# Patient Record
Sex: Male | Born: 1985
Health system: Southern US, Community
[De-identification: ages and names within clinical notes are randomized; demographics above are authoritative.]

## PROBLEM LIST (undated history)

## (undated) DIAGNOSIS — I10 Essential (primary) hypertension: Secondary | ICD-10-CM

## (undated) DIAGNOSIS — N200 Calculus of kidney: Secondary | ICD-10-CM

## (undated) HISTORY — DX: Essential (primary) hypertension: I10

## (undated) HISTORY — PX: TONSILLECTOMY: SUR1361

---
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
## 2008-04-04 ENCOUNTER — Ambulatory Visit (HOSPITAL_COMMUNITY): Admission: RE | Admit: 2008-04-04 | Discharge: 2008-04-04 | Payer: Self-pay | Admitting: Family Medicine

## 2015-10-10 ENCOUNTER — Encounter (HOSPITAL_COMMUNITY)
Admission: RE | Admit: 2015-10-10 | Discharge: 2015-10-10 | Disposition: A | Discharge status: Home / Self Care | Payer: BLUE CROSS/BLUE SHIELD | Source: Ambulatory Visit | Attending: Surgery | Admitting: Surgery

## 2015-10-10 ENCOUNTER — Encounter (HOSPITAL_COMMUNITY): Payer: Self-pay

## 2015-10-10 DIAGNOSIS — I10 Essential (primary) hypertension: Secondary | ICD-10-CM | POA: Diagnosis not present

## 2015-10-10 DIAGNOSIS — E663 Overweight: Secondary | ICD-10-CM | POA: Diagnosis not present

## 2015-10-10 DIAGNOSIS — E785 Hyperlipidemia, unspecified: Secondary | ICD-10-CM | POA: Diagnosis not present

## 2015-10-10 DIAGNOSIS — K645 Perianal venous thrombosis: Secondary | ICD-10-CM | POA: Diagnosis present

## 2015-10-10 DIAGNOSIS — K644 Residual hemorrhoidal skin tags: Secondary | ICD-10-CM | POA: Diagnosis not present

## 2015-10-10 DIAGNOSIS — F17210 Nicotine dependence, cigarettes, uncomplicated: Secondary | ICD-10-CM | POA: Diagnosis not present

## 2015-10-10 LAB — BASIC METABOLIC PANEL
Anion gap: 5 (ref 5–15)
BUN: 13 mg/dL (ref 6–20)
CO2: 28 mmol/L (ref 22–32)
Calcium: 9.1 mg/dL (ref 8.9–10.3)
Chloride: 105 mmol/L (ref 101–111)
Creatinine, Ser: 1.1 mg/dL (ref 0.61–1.24)
GFR calc Af Amer: >60 mL/min (ref >60)
GFR calc non Af Amer: >60 mL/min (ref >60)
Glucose, Bld: 85 mg/dL (ref 65–99)
Potassium: 4.5 mmol/L (ref 3.5–5.1)
Sodium: 138 mmol/L (ref 135–145)

## 2015-10-10 LAB — CBC WITH DIFFERENTIAL/PLATELET
Basophils Absolute: 0.1 10*3/uL (ref 0.0–0.1)
Basophils Relative: 1 %
Eosinophils Absolute: 0.7 10*3/uL (ref 0.0–0.7)
Eosinophils Relative: 7 %
HCT: 45.1 % (ref 39.0–52.0)
Hemoglobin: 15.5 g/dL (ref 13.0–17.0)
Lymphocytes Relative: 26 %
Lymphs Abs: 2.7 10*3/uL (ref 0.7–4.0)
MCH: 30.6 pg (ref 26.0–34.0)
MCHC: 34.4 g/dL (ref 30.0–36.0)
MCV: 89 fL (ref 78.0–100.0)
Monocytes Absolute: 0.8 10*3/uL (ref 0.1–1.0)
Monocytes Relative: 7 %
Neutro Abs: 6.2 10*3/uL (ref 1.7–7.7)
Neutrophils Relative %: 59 %
Platelets: 275 10*3/uL (ref 150–400)
RBC: 5.07 MIL/uL (ref 4.22–5.81)
RDW: 13 % (ref 11.5–15.5)
WBC: 10.4 10*3/uL (ref 4.0–10.5)

## 2015-10-10 NOTE — Pre-Procedure Instructions (Signed)
Patient given information to sign up for my chart at home. 

## 2015-10-10 NOTE — Patient Instructions (Signed)
Jerome Glover  10/10/2015     @   Your procedure is scheduled on  10/12/2015   Report to [facility] at  730  A.M.  Call this number if you have problems the morning of surgery:  [phone]   Remember:  Do not eat food or drink liquids after midnight.  Take these medicines the morning of surgery with A SIP OF WATER none   Do not wear jewelry, make-up or nail polish.  Do not wear lotions, powders, or perfumes.  You may wear deoderant.  Do not shave 48 hours prior to surgery.  Men may shave face and neck.  Do not bring valuables to the hospital.  [facility] is not responsible for any belongings or valuables.  Contacts, dentures or bridgework may not be worn into surgery.  Leave your suitcase in the car.  After surgery it may be brought to your room.  For patients admitted to the hospital, discharge time will be determined by your treatment team.  Patients discharged the day of surgery will not be allowed to drive home.   Name and phone number of your driver:   family Special instructions:  none  Please read over the following fact sheets that you were given. Coughing and Deep Breathing, Surgical Site Infection Prevention, Anesthesia Post-op Instructions and Care and Recovery After Surgery      Hemorrhoids Hemorrhoids are swollen veins around the rectum or anus. There are two types of hemorrhoids:   Internal hemorrhoids. These occur in the veins just inside the rectum. They may poke through to the outside and become irritated and painful.  External hemorrhoids. These occur in the veins outside the anus and can be felt as a painful swelling or hard lump near the anus. CAUSES  Pregnancy.   Obesity.   Constipation or diarrhea.   Straining to have a bowel movement.   Sitting for long periods on the toilet.  Heavy lifting or other activity that caused you to strain.  Anal intercourse. SYMPTOMS   Pain.   Anal itching  or irritation.   Rectal bleeding.   Fecal leakage.   Anal swelling.   One or more lumps around the anus.  DIAGNOSIS  Your caregiver may be able to diagnose hemorrhoids by visual examination. Other examinations or tests that may be performed include:   Examination of the rectal area with a gloved hand (digital rectal exam).   Examination of anal canal using a small tube (scope).   A blood test if you have lost a significant amount of blood.  A test to look inside the colon (sigmoidoscopy or colonoscopy). TREATMENT Most hemorrhoids can be treated at home. However, if symptoms do not seem to be getting better or if you have a lot of rectal bleeding, your caregiver may perform a procedure to help make the hemorrhoids get smaller or remove them completely. Possible treatments include:   Placing a rubber band at the base of the hemorrhoid to cut off the circulation (rubber band ligation).   Injecting a chemical to shrink the hemorrhoid (sclerotherapy).   Using a tool to burn the hemorrhoid (infrared light therapy).   Surgically removing the hemorrhoid (hemorrhoidectomy).   Stapling the hemorrhoid to block blood flow to the tissue (hemorrhoid stapling).  HOME CARE INSTRUCTIONS   Eat foods with fiber, such as whole grains, beans, nuts, fruits, and vegetables. Ask your doctor about taking products with added fiber in  them (fibersupplements).  Increase fluid intake. Drink enough water and fluids to keep your urine clear or pale yellow.   Exercise regularly.   Go to the bathroom when you have the urge to have a bowel movement. Do not wait.   Avoid straining to have bowel movements.   Keep the anal area dry and clean. Use wet toilet paper or moist towelettes after a bowel movement.   Medicated creams and suppositories may be used or applied as directed.   Only take over-the-counter or prescription medicines as directed by your caregiver.   Take warm sitz baths  for 15-20 minutes, 3-4 times a day to ease pain and discomfort.   Place ice packs on the hemorrhoids if they are tender and swollen. Using ice packs between sitz baths may be helpful.   Put ice in a plastic bag.   Place a towel between your skin and the bag.   Leave the ice on for 15-20 minutes, 3-4 times a day.   Do not use a donut-shaped pillow or sit on the toilet for long periods. This increases blood pooling and pain.  SEEK MEDICAL CARE IF:  You have increasing pain and swelling that is not controlled by treatment or medicine.  You have uncontrolled bleeding.  You have difficulty or you are unable to have a bowel movement.  You have pain or inflammation outside the area of the hemorrhoids. MAKE SURE YOU:  Understand these instructions.  Will watch your condition.  Will get help right away if you are not doing well or get worse.   This information is not intended to replace advice given to you by your health care provider. Make sure you discuss any questions you have with your health care provider.   Document Released: 04/04/2000 Document Revised: 03/24/2012 Document Reviewed: 02/10/2012 Elsevier Interactive Patient Education 2016 [employer] Surgical Procedures for Hemorrhoids, Care After Refer to this sheet in the next few weeks. These instructions provide you with information about caring for yourself after your procedure. Your health care provider may also give you more specific instructions. Your treatment has been planned according to current medical practices, but problems sometimes occur. Call your health care provider if you have any problems or questions after your procedure. WHAT TO EXPECT AFTER THE PROCEDURE After your procedure, it is common to have:  Rectal pain.  Pain when you are having a bowel movement.  Slight rectal bleeding. HOME CARE INSTRUCTIONS Medicines  Take over-the-counter and prescription medicines only as told by your health care  provider.  Do not drive or operate heavy machinery while taking prescription pain medicine.  Use a stool softener or a bulk laxative as told by your health care provider. Activities  Rest at home. Return to your normal activities as told by your health care provider.  Do not lift anything that is heavier than 10 lb (4.5 kg).  Do not sit for long periods of time. Take a walk every day or as told by your health care provider.  Do not strain to have a bowel movement. Do not spend a long time sitting on the toilet. Eating and Drinking  Eat foods that contain fiber, such as whole grains, beans, nuts, fruits, and vegetables.  Drink enough fluid to keep your urine clear or pale yellow. General Instructions  Sit in a warm bath 2-3 times per day to relieve soreness or itching.  Keep all follow-up visits as told by your health care provider. This is important. SEEK MEDICAL CARE  IF:  Your pain medicine is not helping.  You have a fever or chills.  You become constipated.  You have trouble passing urine. SEEK IMMEDIATE MEDICAL CARE IF:  You have very bad rectal pain.  You have heavy bleeding from your rectum.   This information is not intended to replace advice given to you by your health care provider. Make sure you discuss any questions you have with your health care provider.   Document Released: 06/28/2003 Document Revised: 12/27/2014 Document Reviewed: 07/03/2014 Elsevier Interactive Patient Education 2016 [employer] PATIENT INSTRUCTIONS POST-ANESTHESIA  IMMEDIATELY FOLLOWING SURGERY:  Do not drive or operate machinery for the first twenty four hours after surgery.  Do not make any important decisions for twenty four hours after surgery or while taking narcotic pain medications or sedatives.  If you develop intractable nausea and vomiting or a severe headache please notify your doctor immediately.  FOLLOW-UP:  Please make an appointment with your surgeon as instructed.  You do not need to follow up with anesthesia unless specifically instructed to do so.  WOUND CARE INSTRUCTIONS (if applicable):  Keep a dry clean dressing on the anesthesia/puncture wound site if there is drainage.  Once the wound has quit draining you may leave it open to air.  Generally you should leave the bandage intact for twenty four hours unless there is drainage.  If the epidural site drains for more than 36-48 hours please call the anesthesia department.  QUESTIONS?:  Please feel free to call your physician or the hospital operator if you have any questions, and they will be happy to assist you.

## 2015-10-12 ENCOUNTER — Encounter (HOSPITAL_COMMUNITY): Payer: Self-pay | Admitting: *Deleted

## 2015-10-12 ENCOUNTER — Ambulatory Visit (HOSPITAL_COMMUNITY)
Admission: RE | Admit: 2015-10-12 | Discharge: 2015-10-12 | Disposition: A | Discharge status: Home / Self Care | Payer: BLUE CROSS/BLUE SHIELD | Source: Ambulatory Visit | Attending: Surgery | Admitting: Surgery

## 2015-10-12 ENCOUNTER — Ambulatory Visit (HOSPITAL_COMMUNITY): Payer: BLUE CROSS/BLUE SHIELD | Admitting: Anesthesiology

## 2015-10-12 ENCOUNTER — Encounter (HOSPITAL_COMMUNITY)
Admission: RE | Disposition: A | Discharge status: Home / Self Care | Payer: Self-pay | Source: Ambulatory Visit | Attending: Surgery

## 2015-10-12 DIAGNOSIS — E663 Overweight: Secondary | ICD-10-CM | POA: Insufficient documentation

## 2015-10-12 DIAGNOSIS — K645 Perianal venous thrombosis: Secondary | ICD-10-CM | POA: Diagnosis not present

## 2015-10-12 DIAGNOSIS — I10 Essential (primary) hypertension: Secondary | ICD-10-CM | POA: Insufficient documentation

## 2015-10-12 DIAGNOSIS — K644 Residual hemorrhoidal skin tags: Secondary | ICD-10-CM | POA: Insufficient documentation

## 2015-10-12 DIAGNOSIS — F17210 Nicotine dependence, cigarettes, uncomplicated: Secondary | ICD-10-CM | POA: Insufficient documentation

## 2015-10-12 DIAGNOSIS — E785 Hyperlipidemia, unspecified: Secondary | ICD-10-CM | POA: Insufficient documentation

## 2015-10-12 HISTORY — PX: HEMORRHOID SURGERY: SHX153

## 2015-10-12 SURGERY — HEMORRHOIDECTOMY
Anesthesia: General

## 2015-10-12 MED ORDER — FENTANYL CITRATE (PF) 100 MCG/2ML IJ SOLN
INTRAMUSCULAR | Status: AC
  Filled 2015-10-12: qty 2

## 2015-10-12 MED ORDER — FENTANYL CITRATE (PF) 100 MCG/2ML IJ SOLN
25.0000 ug | INTRAMUSCULAR | Status: AC
  Administered 2015-10-12: 25 ug via INTRAVENOUS

## 2015-10-12 MED ORDER — MIDAZOLAM HCL 2 MG/2ML IJ SOLN
INTRAMUSCULAR | Status: AC
  Filled 2015-10-12: qty 2

## 2015-10-12 MED ORDER — ONDANSETRON HCL 4 MG/2ML IJ SOLN
INTRAMUSCULAR | Status: AC
  Filled 2015-10-12: qty 2

## 2015-10-12 MED ORDER — LIDOCAINE HCL 1 % IJ SOLN
INTRAMUSCULAR | Status: DC | PRN
  Administered 2015-10-12: 7 mL via INTRAMUSCULAR

## 2015-10-12 MED ORDER — LACTATED RINGERS IV SOLN
INTRAVENOUS | Status: DC
  Administered 2015-10-12 (×2): via INTRAVENOUS

## 2015-10-12 MED ORDER — HYDROMORPHONE HCL 1 MG/ML IJ SOLN
0.5000 mg | INTRAMUSCULAR | Status: DC | PRN
  Administered 2015-10-12: 0.5 mg via INTRAVENOUS

## 2015-10-12 MED ORDER — CEFAZOLIN SODIUM-DEXTROSE 2-4 GM/100ML-% IV SOLN
2.0000 g | INTRAVENOUS | Status: AC
  Administered 2015-10-12: 2 g via INTRAVENOUS

## 2015-10-12 MED ORDER — ETOMIDATE 2 MG/ML IV SOLN
INTRAVENOUS | Status: AC
  Filled 2015-10-12: qty 10

## 2015-10-12 MED ORDER — PROPOFOL 10 MG/ML IV BOLUS
INTRAVENOUS | Status: AC
  Filled 2015-10-12: qty 20

## 2015-10-12 MED ORDER — BUPIVACAINE HCL (PF) 0.5 % IJ SOLN
INTRAMUSCULAR | Status: AC
  Filled 2015-10-12: qty 30

## 2015-10-12 MED ORDER — CHLORHEXIDINE GLUCONATE CLOTH 2 % EX PADS: 6.0000 | MEDICATED_PAD | Freq: Once | CUTANEOUS | Status: DC

## 2015-10-12 MED ORDER — FENTANYL CITRATE (PF) 100 MCG/2ML IJ SOLN
25.0000 ug | INTRAMUSCULAR | Status: DC | PRN
  Administered 2015-10-12 (×4): 50 ug via INTRAVENOUS
  Filled 2015-10-12 (×2): qty 2

## 2015-10-12 MED ORDER — LIDOCAINE VISCOUS 2 % MT SOLN
OROMUCOSAL | Status: DC | PRN
  Administered 2015-10-12: 15 mL

## 2015-10-12 MED ORDER — LIDOCAINE HCL (CARDIAC) 10 MG/ML IV SOLN
INTRAVENOUS | Status: DC | PRN
  Administered 2015-10-12: 50 mg via INTRAVENOUS

## 2015-10-12 MED ORDER — OXYCODONE-ACETAMINOPHEN 5-325 MG PO TABS: 1.0000 | ORAL_TABLET | ORAL | Status: DC | PRN

## 2015-10-12 MED ORDER — LIDOCAINE HCL (PF) 1 % IJ SOLN
INTRAMUSCULAR | Status: AC
  Filled 2015-10-12: qty 30

## 2015-10-12 MED ORDER — CEFAZOLIN SODIUM-DEXTROSE 2-4 GM/100ML-% IV SOLN
INTRAVENOUS | Status: AC
  Filled 2015-10-12: qty 100

## 2015-10-12 MED ORDER — SURGILUBE EX GEL
CUTANEOUS | Status: DC | PRN
  Administered 2015-10-12: 1 via TOPICAL

## 2015-10-12 MED ORDER — MIDAZOLAM HCL 2 MG/2ML IJ SOLN
INTRAMUSCULAR | Status: DC | PRN
  Administered 2015-10-12 (×2): 1 mg via INTRAVENOUS

## 2015-10-12 MED ORDER — PROPOFOL 10 MG/ML IV BOLUS
INTRAVENOUS | Status: DC | PRN
  Administered 2015-10-12: 20 mg via INTRAVENOUS
  Administered 2015-10-12: 180 mg via INTRAVENOUS
  Administered 2015-10-12: 20 mg via INTRAVENOUS

## 2015-10-12 MED ORDER — HYDROMORPHONE HCL 1 MG/ML IJ SOLN
INTRAMUSCULAR | Status: AC
  Filled 2015-10-12: qty 1

## 2015-10-12 MED ORDER — LIDOCAINE VISCOUS 2 % MT SOLN
OROMUCOSAL | Status: AC
  Filled 2015-10-12: qty 15

## 2015-10-12 MED ORDER — ONDANSETRON HCL 4 MG/2ML IJ SOLN
4.0000 mg | Freq: Once | INTRAMUSCULAR | Status: AC
  Administered 2015-10-12: 4 mg via INTRAVENOUS

## 2015-10-12 MED ORDER — FENTANYL CITRATE (PF) 100 MCG/2ML IJ SOLN
INTRAMUSCULAR | Status: DC | PRN
  Administered 2015-10-12: 25 ug via INTRAVENOUS
  Administered 2015-10-12 (×2): 50 ug via INTRAVENOUS
  Administered 2015-10-12: 25 ug via INTRAVENOUS
  Administered 2015-10-12: 50 ug via INTRAVENOUS
  Administered 2015-10-12 (×2): 25 ug via INTRAVENOUS
  Administered 2015-10-12: 50 ug via INTRAVENOUS

## 2015-10-12 MED ORDER — ONDANSETRON HCL 4 MG/2ML IJ SOLN: 4.0000 mg | Freq: Once | INTRAMUSCULAR | Status: DC | PRN

## 2015-10-12 MED ORDER — 0.9 % SODIUM CHLORIDE (POUR BTL) OPTIME
TOPICAL | Status: DC | PRN
  Administered 2015-10-12: 1000 mL

## 2015-10-12 MED ORDER — MIDAZOLAM HCL 2 MG/2ML IJ SOLN
1.0000 mg | INTRAMUSCULAR | Status: DC | PRN
  Administered 2015-10-12: 2 mg via INTRAVENOUS

## 2015-10-12 SURGICAL SUPPLY — 27 items
BAG HAMPER (MISCELLANEOUS) ×2 IMPLANT
CLOTH BEACON ORANGE TIMEOUT ST (SAFETY) ×2 IMPLANT
COVER LIGHT HANDLE STERIS (MISCELLANEOUS) ×4 IMPLANT
DECANTER SPIKE VIAL GLASS SM (MISCELLANEOUS) ×4 IMPLANT
DRAPE PROXIMA HALF (DRAPES) ×2 IMPLANT
ELECT REM PT RETURN 9FT ADLT (ELECTROSURGICAL) ×2
ELECTRODE REM PT RTRN 9FT ADLT (ELECTROSURGICAL) ×1 IMPLANT
FORMALIN 10 PREFIL 120ML (MISCELLANEOUS) ×2 IMPLANT
GAUZE SPONGE 4X4 12PLY STRL (GAUZE/BANDAGES/DRESSINGS) ×2 IMPLANT
GLOVE BIOGEL M 7.0 STRL (GLOVE) ×2 IMPLANT
GLOVE BIOGEL PI IND STRL 7.0 (GLOVE) ×2 IMPLANT
GLOVE BIOGEL PI IND STRL 7.5 (GLOVE) ×1 IMPLANT
GLOVE BIOGEL PI INDICATOR 7.0 (GLOVE) ×2
GLOVE BIOGEL PI INDICATOR 7.5 (GLOVE) ×1
GLOVE ECLIPSE 7.0 STRL STRAW (GLOVE) ×2 IMPLANT
GOWN STRL REUS W/TWL LRG LVL3 (GOWN DISPOSABLE) ×2 IMPLANT
HEMOSTAT SURGICEL 4X8 (HEMOSTASIS) ×2 IMPLANT
KIT ROOM TURNOVER APOR (KITS) ×2 IMPLANT
LIGASURE IMPACT 36 18CM CVD LR (INSTRUMENTS) ×2 IMPLANT
MANIFOLD NEPTUNE II (INSTRUMENTS) ×2 IMPLANT
NEEDLE HYPO 25X1 1.5 SAFETY (NEEDLE) ×2 IMPLANT
NS IRRIG 1000ML POUR BTL (IV SOLUTION) ×2 IMPLANT
PACK PERI GYN (CUSTOM PROCEDURE TRAY) ×2 IMPLANT
PAD ARMBOARD 7.5X6 YLW CONV (MISCELLANEOUS) ×2 IMPLANT
SET BASIN LINEN APH (SET/KITS/TRAYS/PACK) ×2 IMPLANT
SUT SILK 0 FSL (SUTURE) ×2 IMPLANT
SYRINGE 10CC LL (SYRINGE) ×2 IMPLANT

## 2015-10-12 NOTE — H&P (Signed)
Subjective:This 30 year old male presents foranal pain and fullness x 5 days for which he sought evaluated by his PMD 2 days ago. Patient reports he has known hemorrhoids that cause itching and burning pain, flares of which are typically relieved with topical medications. However, the same medications provided no such relief this time. Patient reports frequent straining with chronically hard stools and denies blood with BM, nausea/vomiting, or fever/chills.   Review of Symptoms:  Constitutional:No fevers, chills, or unexplained weight loss Head:Atraumatic; no masses; no abnormalities Eyes:No visual changes or eye pain Nose/Mouth/Throat:No nasal congestion, rhinorrhea, oral lesions, postnasal drip or sore throat  Cardiovascular: No chest pain or palpitations.  Respiratory:No cough, shortness of breath or wheezing  Gastrointestin: Anal pain and fullness as per HPI,  no diarrhea, constipation, blood in stools, abdominal pain, vomiting or heartburn Genitourinary:No urinary frequency, hematuria, incontinence, or dysuria Musculoskeletal:No arthalgias, myalgias or joint swelling Skin:No rash or bothersome skin lesions Hematolgic/Lymphatic:No easy bruising, easy bleeding or swollen glands  Past Medical History  Surgical History:  None Medical Problems: High Blood pressure, Hyperlipidemia, Overweight  Social History  Preferred Language: English Race:  White Age: [age] year Marital Status:  S Lives with:  Spouse  Smoking Status: Current every day smoker reviewed on 10/11/2015 Started Date: 10/11/1999 Packs per day: 1.00  Functional Status reviewed on 10/10/2015 ------------------------------------------------ Bathing: Normal Cooking: Normal Dressing: Normal Driving: Normal Eating: Normal Managing Meds: Normal Oral Care: Normal Shopping: Normal Toileting: Normal Transferring: Normal Walking: Normal  Cognitive Status reviewed on  10/10/2015 ------------------------------------------------ Attention: Normal Decision Making: Normal Language: Normal Memory: Normal Motor: Normal Perception: Normal Problem Solving: Normal Visual and Spatial: Normal  Family Health History Mother, Living; Diabetes mellitus, type 2 (adult);  Father, Living; Hypertension (high blood pressure); Heart disease; Glaucoma  Vital Signs:  Vitals as of: 10/10/2015: Systolic 153: Diastolic 85: Heart Rate 62: Temp 98.90F (Temporal): Height 555ft 8in: Weight 202Lbs 0 Ounces: Pain Level 8: BMI 30.71   BMI : 30.71 kg/m2  Objective Information: General:Well appearing, well nourished in no distress. Skin:no rash or prominent lesions Head:Atraumatic; no masses; no abnormalities Eyes:conjunctiva clear, EOM intact, PERRL Mouth:Mucous membranes moist, no mucosal lesions. Throat:no erythema, exudates or lesions. Neck:Supple without lymphadenopathy.  Heart:RRR, no murmur Lungs:CTA bilaterally, no wheezes, rhonchi, rales.  Breathing unlabored. Abdomen:Soft, NT/ND, no HSM, no masses. Rectal: Hemorrhoids:  3  exquisitely tender thrombosed external  Extremities:No deformities, clubbing, cyanosis, or edema  Plan: - All risks, benefits, and alternatives to hemorrhoidectomy for painful thrombosed hemorrhoid(s) discussed with the patient, who elects to proceed, all of patient's and his wife's questions were answered to their expressed satisfaction, and informed consent obtained and documented - As patient just ate before this appointment and he's been tolerating the pain for 5 days now, will plan for elective hemorrhoidectomy in 2 days on Friday 10/12/2015 - Topical and oral systemic analgesia, along with 3x daily sitz baths   2 weeks post-operatively

## 2015-10-12 NOTE — Discharge Instructions (Addendum)
In addition to included general post-operative instructions for hemorrhoidectomy,  Diet: Resume home heart healthy diet, ensure adequate hydration (drink water) to reduce straining with hard stools.  Activity: No heavy lifting (children, pets, laundry) or strenuous activity until follow-up, but light activity and walking are encouraged. Do not drive or drink alcohol if taking narcotic pain medications.   Wound care: May shower. Also, may find pain relief improves with shallow hot water baths (+/- adding salt to the water). Topical pain medication (cream) may also be used for pain relief starting 2 days after surgery.  Medications: Resume all home medications. For mild to moderate pain: acetaminophen (Tylenol) or ibuprofen. Narcotic pain medication can be used for severe pain, though may cause nausea, constipation, and drowsiness. Do not combine Tylenol and Percocet within a 6 hour period as Percocet contains Tylenol.  Call office [phone]) at any time if any questions, worsening pain, fevers/chills, bleeding, drainage from incision site, or other concerns.

## 2015-10-12 NOTE — Transfer of Care (Signed)
Immediate Anesthesia Transfer of Care Note  Patient: Jerome Glover  Procedure(s) Performed: Procedure(s): EXTENSIVE HEMORRHOIDECTOMY (N/A)  Patient Location: PACU  Anesthesia Type:General  Level of Consciousness: awake and patient cooperative  Airway & Oxygen Therapy: Patient Spontanous Breathing and non-rebreather face mask  Post-op Assessment: Report given to RN, Post -op Vital signs reviewed and stable and Patient moving all extremities  Post vital signs: Reviewed and stable  Last Vitals:  Filed Vitals:   10/12/15 0816 10/12/15 1035  BP:  147/98  Pulse:  69  Temp: 36.7 C 36.8 C    Last Pain: There were no vitals filed for this visit.    Patients Stated Pain Goal: 5 (10/12/15 0809)  Complications: No apparent anesthesia complications

## 2015-10-12 NOTE — Op Note (Signed)
SURGICAL OPERATIVE REPORT   ATTENDING Surgeon(s): Ancil LinseyJason Evan Davis, MD  ASSISTANT(S): None   ANESTHESIA: GETA  PRE-OPERATIVE DIAGNOSIS: Exquisitely painful thrombosed very large (nearly circumferentially-extending) external hemorrhoids x2 (thrombosed 7:30 o'clock and large non-thrombosed 4:30 o'clock positions)  POST-OPERATIVE DIAGNOSIS: Exquisitely painful thrombosed very large (nearly circumferentially-extending) external hemorrhoids x2 (thrombosed 7:30 o'clock and large non-thrombosed 4:30 o'clock positions)  PROCEDURE(S):  1.) Anoscopy 2.) Extensive hemorrhectomy (Right posterior thrombosed external and Left posterior non-thrombosed external hemorrhoids) and excision of skin tag(s) x2  INTRAOPERATIVE FINDINGS: Very large (nearly circumferential) thrombosed and non-thrombosed external hemorrhoids x2 with skin tags  INTRAVENOUS FLUIDS: 1000 mL crystalloid   ESTIMATED BLOOD LOSS: Minimal (<20 mL)  URINE OUTPUT: No foley   SPECIMENS: Right posterior thrombosed external and Left posterior non-thrombosed external hemorrhoids and skin tag(s) x2  IMPLANTS: None  DRAINS: None   COMPLICATIONS: None apparent   CONDITION AT END OF PROCEDURE: Hemodynamically stable and extubated   DISPOSITION OF PATIENT: PACU  INDICATIONS FOR PROCEDURE:  Patient is a 30 y.o. male who presented with exquisitely painful external hemorrhoids no longer controlled with topical and oral pain medications patient typically utilizes for relief. On exam, very large Right posterior external hemorrhoid was found to be thrombosed, consistent with his described much worsened pain. All risks, benefits, and alternatives to above procedure were discussed with the patient and his wife, all of patient's and his wife's questions were answered to their expressed satisfaction, and informed consent was obtained and documented.  DETAILS OF PROCEDURE: Patient was brought to the operative suite and appropriately  identified. General anesthesia was administered along with appropriate pre-operative antibiotics, and endotracheal intubation was performed by anesthetist. In high lithotomy position, operative site was prepped and draped in the usual sterile fashion, and following a brief time out, rigid anoscopy was performed with findings of very large (nearly circumferential) thrombosed (Right posterior) and non-thrombosed (Left posterior) external hemorrhoids x2 with skin tags x2. Local anesthetic was injected, and three hemorrhoidal pedicles were identified. An Alice clamp was placed near the base of first the Right posterior pedicle and then the Left posterior pedicle near the dentate line and retracted externally to exteriorize the hemorrhoidal pedicle. Retracted hemorrhoid was carefully dissected from underlying/adherent tissues, and Ligasure was advanced across the base of the hemorrhoidal pedicle and fired, and the above was repeated for Left posterior external hemorrhoid as well. Hemostasis was confirmed, additional local anesthetic was injected, and lidocaine gel-soaked Surgicel wrapped around 4x4" gauze was inserted into the anal canal for additional hemostasis and pain control.  Patient was then safely able to be extubated, awaken, and transferred to PACU for post-operative monitoring and care.  I was present for all aspects of the above procedure, and there were no complications apparent.

## 2015-10-12 NOTE — Interval H&P Note (Signed)
History and Physical Interval Note:  10/12/2015 9:03 AM  Barbaraann FasterWalter T Horkey  has presented today for surgery, with the diagnosis of thrombosed hemorrhoid  The various methods of treatment have been discussed with the patient and family. After consideration of risks, benefits and other options for treatment, the patient has consented to  Procedure(s): EXTENSIVE HEMORRHOIDECTOMY (N/A) as a surgical intervention .  The patient's history has been reviewed, patient examined, no change in status, stable for surgery.  I have reviewed the patient's chart and labs.  Questions were answered to the patient's satisfaction.     Ancil LinseyJason Evan Riannah Stagner

## 2015-10-12 NOTE — Anesthesia Preprocedure Evaluation (Signed)
Anesthesia Evaluation  Patient identified by MRN, date of birth, ID band Patient awake    Reviewed: Allergy & Precautions, NPO status , Patient's Chart, lab work & pertinent test results  Airway Mallampati: II  TM Distance: >3 FB     Dental  (+) Teeth Intact   Pulmonary Current Smoker,    breath sounds clear to auscultation       Cardiovascular negative cardio ROS   Rhythm:Regular Rate:Normal     Neuro/Psych    GI/Hepatic negative GI ROS,   Endo/Other    Renal/GU      Musculoskeletal   Abdominal   Peds  Hematology   Anesthesia Other Findings   Reproductive/Obstetrics                             Anesthesia Physical Anesthesia Plan  ASA: I  Anesthesia Plan: General   Post-op Pain Management:    Induction: Intravenous  Airway Management Planned: LMA  Additional Equipment:   Intra-op Plan:   Post-operative Plan: Extubation in OR  Informed Consent: I have reviewed the patients History and Physical, chart, labs and discussed the procedure including the risks, benefits and alternatives for the proposed anesthesia with the patient or authorized representative who has indicated his/her understanding and acceptance.     Plan Discussed with:   Anesthesia Plan Comments:         Anesthesia Quick Evaluation

## 2015-10-12 NOTE — Anesthesia Procedure Notes (Addendum)
Procedure Name: LMA Insertion Date/Time: 10/12/2015 9:39 AM Performed by: Franco NonesYATES, Zaelyn Noack S Pre-anesthesia Checklist: Patient identified, Patient being monitored, Emergency Drugs available, Timeout performed and Suction available Patient Re-evaluated:Patient Re-evaluated prior to inductionOxygen Delivery Method: Circle System Utilized Preoxygenation: Pre-oxygenation with 100% oxygen Intubation Type: IV induction Ventilation: Mask ventilation without difficulty LMA: LMA inserted LMA Size: 4.0 Number of attempts: 1 Placement Confirmation: positive ETCO2 and breath sounds checked- equal and bilateral Tube secured with: Tape Dental Injury: Teeth and Oropharynx as per pre-operative assessment

## 2015-10-12 NOTE — Anesthesia Postprocedure Evaluation (Signed)
Anesthesia Post Note  Patient: Jerome Glover  Procedure(s) Performed: Procedure(s) (LRB): EXTENSIVE HEMORRHOIDECTOMY (N/A)  Patient location during evaluation: PACU Anesthesia Type: General Level of consciousness: awake Pain management: satisfactory to patient Vital Signs Assessment: post-procedure vital signs reviewed and stable Respiratory status: spontaneous breathing and patient connected to nasal cannula oxygen Cardiovascular status: blood pressure returned to baseline Anesthetic complications: no    Last Vitals:  Filed Vitals:   10/12/15 0816 10/12/15 1035  BP:  147/98  Pulse:  69  Temp: 36.7 C 36.8 C    Last Pain:  Filed Vitals:   10/12/15 1043  PainSc: 9                  Eulalia Ellerman

## 2015-10-16 ENCOUNTER — Encounter (HOSPITAL_COMMUNITY): Payer: Self-pay | Admitting: Surgery

## 2018-01-16 ENCOUNTER — Encounter (HOSPITAL_COMMUNITY): Payer: Self-pay | Admitting: Emergency Medicine

## 2018-01-16 ENCOUNTER — Emergency Department (HOSPITAL_COMMUNITY)
Admission: EM | Admit: 2018-01-16 | Discharge: 2018-01-16 | Disposition: A | Discharge status: Home / Self Care | Payer: BLUE CROSS/BLUE SHIELD | Attending: Emergency Medicine | Admitting: Emergency Medicine

## 2018-01-16 ENCOUNTER — Other Ambulatory Visit: Payer: Self-pay

## 2018-01-16 ENCOUNTER — Emergency Department (HOSPITAL_COMMUNITY): Payer: BLUE CROSS/BLUE SHIELD

## 2018-01-16 DIAGNOSIS — Z79899 Other long term (current) drug therapy: Secondary | ICD-10-CM | POA: Insufficient documentation

## 2018-01-16 DIAGNOSIS — F17210 Nicotine dependence, cigarettes, uncomplicated: Secondary | ICD-10-CM | POA: Insufficient documentation

## 2018-01-16 DIAGNOSIS — R1032 Left lower quadrant pain: Secondary | ICD-10-CM | POA: Diagnosis present

## 2018-01-16 DIAGNOSIS — N200 Calculus of kidney: Secondary | ICD-10-CM | POA: Insufficient documentation

## 2018-01-16 LAB — URINALYSIS, ROUTINE W REFLEX MICROSCOPIC
Bacteria, UA: NONE SEEN
Bilirubin Urine: NEGATIVE
Glucose, UA: NEGATIVE mg/dL
Ketones, ur: 5 mg/dL — AB
Leukocytes, UA: NEGATIVE
Nitrite: NEGATIVE
Protein, ur: NEGATIVE mg/dL
RBC / HPF: >50 RBC/hpf — ABNORMAL HIGH (ref 0–5)
Specific Gravity, Urine: 1.021 (ref 1.005–1.030)
pH: 7 (ref 5.0–8.0)

## 2018-01-16 LAB — BASIC METABOLIC PANEL
Anion gap: 5 (ref 5–15)
BUN: 10 mg/dL (ref 6–20)
CO2: 27 mmol/L (ref 22–32)
Calcium: 8.8 mg/dL — ABNORMAL LOW (ref 8.9–10.3)
Chloride: 105 mmol/L (ref 98–111)
Creatinine, Ser: 1.45 mg/dL — ABNORMAL HIGH (ref 0.61–1.24)
GFR calc Af Amer: >60 mL/min (ref >60)
GFR calc non Af Amer: >60 mL/min (ref >60)
Glucose, Bld: 123 mg/dL — ABNORMAL HIGH (ref 70–99)
Potassium: 4.1 mmol/L (ref 3.5–5.1)
Sodium: 137 mmol/L (ref 135–145)

## 2018-01-16 LAB — CBC WITH DIFFERENTIAL/PLATELET
Basophils Absolute: 0.1 10*3/uL (ref 0.0–0.1)
Basophils Relative: 1 %
Eosinophils Absolute: 0.1 10*3/uL (ref 0.0–0.7)
Eosinophils Relative: 1 %
HCT: 41.5 % (ref 39.0–52.0)
Hemoglobin: 14.4 g/dL (ref 13.0–17.0)
Lymphocytes Relative: 12 %
Lymphs Abs: 1.8 10*3/uL (ref 0.7–4.0)
MCH: 32 pg (ref 26.0–34.0)
MCHC: 34.7 g/dL (ref 30.0–36.0)
MCV: 92.2 fL (ref 78.0–100.0)
Monocytes Absolute: 0.9 10*3/uL (ref 0.1–1.0)
Monocytes Relative: 6 %
Neutro Abs: 12.3 10*3/uL — ABNORMAL HIGH (ref 1.7–7.7)
Neutrophils Relative %: 80 %
Platelets: 271 10*3/uL (ref 150–400)
RBC: 4.5 MIL/uL (ref 4.22–5.81)
RDW: 13 % (ref 11.5–15.5)
WBC: 15.3 10*3/uL — ABNORMAL HIGH (ref 4.0–10.5)

## 2018-01-16 MED ORDER — OXYCODONE-ACETAMINOPHEN 5-325 MG PO TABS: 1.0000 | ORAL_TABLET | ORAL | 0 refills | Status: DC | PRN

## 2018-01-16 MED ORDER — SODIUM CHLORIDE 0.9 % IV BOLUS
1000.0000 mL | Freq: Once | INTRAVENOUS | Status: AC
  Administered 2018-01-16: 1000 mL via INTRAVENOUS

## 2018-01-16 MED ORDER — TAMSULOSIN HCL 0.4 MG PO CAPS: 0.4000 mg | ORAL_CAPSULE | Freq: Every day | ORAL | 0 refills | Status: DC

## 2018-01-16 MED ORDER — MORPHINE SULFATE (PF) 4 MG/ML IV SOLN
4.0000 mg | Freq: Once | INTRAVENOUS | Status: AC
  Administered 2018-01-16: 4 mg via INTRAVENOUS
  Filled 2018-01-16: qty 1

## 2018-01-16 MED ORDER — KETOROLAC TROMETHAMINE 30 MG/ML IJ SOLN
30.0000 mg | Freq: Once | INTRAMUSCULAR | Status: AC
  Administered 2018-01-16: 30 mg via INTRAVENOUS
  Filled 2018-01-16: qty 1

## 2018-01-16 MED ORDER — ONDANSETRON 4 MG PO TBDP: 4.0000 mg | ORAL_TABLET | Freq: Three times a day (TID) | ORAL | 0 refills | Status: AC | PRN

## 2018-01-16 MED ORDER — ONDANSETRON HCL 4 MG/2ML IJ SOLN
4.0000 mg | Freq: Once | INTRAMUSCULAR | Status: AC
  Administered 2018-01-16: 4 mg via INTRAVENOUS
  Filled 2018-01-16: qty 2

## 2018-01-16 NOTE — ED Triage Notes (Signed)
L flank pain  radiates to abd   No known injury

## 2018-01-16 NOTE — ED Provider Notes (Signed)
[facility] EMERGENCY DEPARTMENT Provider Note   CSN: [id number] Arrival date & time: 01/16/18  2057     History   Chief Complaint Chief Complaint  Patient presents with  . Flank Pain    HPI Jerome Glover is a 32 y.o. male.  Pt presents to the ED today with left sided flank pain.  The pt said the pain radiates around to his abdomen.  He has not had anything like this in the past.  He feels nauseous, no vomiting.     History reviewed. No pertinent past medical history.  There are no active problems to display for this patient.   Past Surgical History:  Procedure Laterality Date  . HEMORRHOID SURGERY N/A 10/12/2015   Procedure: EXTENSIVE HEMORRHOIDECTOMY;  Surgeon: Ancil Linsey, MD;  Location: [facility];  Service: General;  Laterality: N/A;  . TONSILLECTOMY          Home Medications    Prior to Admission medications   Medication Sig Start Date End Date Taking? Authorizing Provider  BIOTIN PO Take 1 capsule by mouth daily.   Yes [provider]  cetirizine (ZYRTEC) 10 MG tablet Take 10 mg by mouth daily.   Yes [provider]  ibuprofen (ADVIL,MOTRIN) 200 MG tablet Take 800 mg by mouth every 6 (six) hours as needed for mild pain or moderate pain.   Yes [provider]  ondansetron (ZOFRAN ODT) 4 MG disintegrating tablet Take 1 tablet (4 mg total) by mouth every 8 (eight) hours as needed. 01/16/18   Jacalyn Lefevre, MD  oxyCODONE-acetaminophen (PERCOCET/ROXICET) 5-325 MG tablet Take 1 tablet by mouth every 4 (four) hours as needed for severe pain. 01/16/18   Jacalyn Lefevre, MD  scopolamine (TRANSDERM-SCOP) 1 MG/3DAYS Place 1 patch onto the skin every 3 (three) days.  01/14/18   [provider]  tamsulosin (FLOMAX) 0.4 MG CAPS capsule Take 1 capsule (0.4 mg total) by mouth daily. 01/16/18   Jacalyn Lefevre, MD    Family History No family history on file.  Social History Social History   Tobacco Use  . Smoking status: Current  Every Day Smoker    Packs/day: 0.50    Years: 15.00    Pack years: 7.50    Types: Cigarettes  . Smokeless tobacco: Former Engineer, water Use Topics  . Alcohol use: Yes    Comment: rare  . Drug use: No     Allergies   Patient has no known allergies.   Review of Systems Review of Systems  Gastrointestinal: Positive for nausea and vomiting.  Genitourinary: Positive for flank pain.  All other systems reviewed and are negative.    Physical Exam Updated Vital Signs BP 140/85 (BP Location: Right Arm)   Pulse 65   Temp 97.9 F (36.6 C) (Oral)   Resp 20   Ht 5\' 6"  (1.676 m)   Wt 90.7 kg   SpO2 100%   BMI 32.28 kg/m   Physical Exam  Constitutional: He is oriented to person, place, and time. He appears well-developed and well-nourished.  HENT:  Head: Normocephalic and atraumatic.  Right Ear: External ear normal.  Left Ear: External ear normal.  Nose: Nose normal.  Mouth/Throat: Oropharynx is clear and moist.  Eyes: Pupils are equal, round, and reactive to light. Conjunctivae and EOM are normal.  Neck: Normal range of motion. Neck supple.  Cardiovascular: Normal rate, regular rhythm, normal heart sounds and intact distal pulses.  Pulmonary/Chest: Effort normal and breath sounds normal.  Abdominal: Soft.  Bowel sounds are normal.  Musculoskeletal: Normal range of motion.  Neurological: He is alert and oriented to person, place, and time.  Skin: Skin is warm and dry. Capillary refill takes less than 2 seconds.  Psychiatric: He has a normal mood and affect. His behavior is normal. Judgment and thought content normal.  Nursing note and vitals reviewed.    ED Treatments / Results  Labs (all labs ordered are listed, but only abnormal results are displayed) Labs Reviewed  URINALYSIS, ROUTINE W REFLEX MICROSCOPIC - Abnormal; Notable for the following components:      Result Value   Hgb urine dipstick MODERATE (*)    Ketones, ur 5 (*)    RBC / HPF >50 (*)    All other  components within normal limits  BASIC METABOLIC PANEL - Abnormal; Notable for the following components:   Glucose, Bld 123 (*)    Creatinine, Ser 1.45 (*)    Calcium 8.8 (*)    All other components within normal limits  CBC WITH DIFFERENTIAL/PLATELET - Abnormal; Notable for the following components:   WBC 15.3 (*)    Neutro Abs 12.3 (*)    All other components within normal limits    EKG None  Radiology Ct Renal Stone Study  Result Date: 01/16/2018 CLINICAL DATA:  Left flank pain EXAM: CT ABDOMEN AND PELVIS WITHOUT CONTRAST TECHNIQUE: Multidetector CT imaging of the abdomen and pelvis was performed following the standard protocol without IV contrast. COMPARISON:  None. FINDINGS: Lower chest: Mild dependent atelectasis in the bilateral lower lobes. Hepatobiliary: Unenhanced liver is unremarkable. Gallbladder is unremarkable. No intrahepatic or extrahepatic ductal dilatation. Pancreas: Within normal limits. Spleen: Within normal limits. Adrenals/Urinary Tract: Adrenal glands within normal limits. Right kidney is within normal limits. Mild left perirenal edema with perinephric stranding. Associated mild left hydronephrosis. 3 mm proximal left ureteral calculus at the L2-3 level (coronal image 57). Bladder is underdistended but unremarkable. Stomach/Bowel: Stomach is within normal limits. No evidence of bowel obstruction. Normal appendix (series 2/image 57). Vascular/Lymphatic: No evidence of abdominal aortic aneurysm. No suspicious abdominopelvic lymphadenopathy. Reproductive: Prostate is unremarkable. Other: No abdominopelvic ascites. Musculoskeletal: Visualized osseous structures are within normal limits. IMPRESSION: 3 mm proximal left ureteral calculus at the L2-3 level. Associated mild left hydronephrosis. Electronically Signed   By: Charline Bills M.D.   On: 01/16/2018 22:35    Procedures Procedures (including critical care time)  Medications Ordered in ED Medications  sodium chloride  0.9 % bolus 1,000 mL (1,000 mLs Intravenous New Bag/Given 01/16/18 2207)  morphine 4 MG/ML injection 4 mg (4 mg Intravenous Given 01/16/18 2209)  ondansetron (ZOFRAN) injection 4 mg (4 mg Intravenous Given 01/16/18 2207)  ketorolac (TORADOL) 30 MG/ML injection 30 mg (30 mg Intravenous Given 01/16/18 2208)     Initial Impression / Assessment and Plan / ED Course  I have reviewed the triage vital signs and the nursing notes.  Pertinent labs & imaging results that were available during my care of the patient were reviewed by me and considered in my medical decision making (see chart for details).    Pt does have a 3 mm stone which should pass on its own.  No UTI.  Pt is feeling much better.  No pain or nausea now.  He will be d/c home with instructions to return if worse.  He is given the number of urology to f/u.  Final Clinical Impressions(s) / ED Diagnoses   Final diagnoses:  Kidney stone    ED Discharge Orders  Ordered    oxyCODONE-acetaminophen (PERCOCET/ROXICET) 5-325 MG tablet  Every 4 hours PRN     01/16/18 2245    ondansetron (ZOFRAN ODT) 4 MG disintegrating tablet  Every 8 hours PRN     01/16/18 2245    tamsulosin (FLOMAX) 0.4 MG CAPS capsule  Daily     01/16/18 2245           Jacalyn Lefevre, MD 01/16/18 2250

## 2018-03-03 ENCOUNTER — Other Ambulatory Visit (HOSPITAL_COMMUNITY): Payer: Self-pay | Admitting: Physician Assistant

## 2018-03-03 ENCOUNTER — Ambulatory Visit (HOSPITAL_COMMUNITY)
Admission: RE | Admit: 2018-03-03 | Discharge: 2018-03-03 | Disposition: A | Discharge status: Home / Self Care | Payer: BLUE CROSS/BLUE SHIELD | Source: Ambulatory Visit | Attending: Physician Assistant | Admitting: Physician Assistant

## 2018-03-03 DIAGNOSIS — R000 Tachycardia, unspecified: Secondary | ICD-10-CM

## 2019-06-24 ENCOUNTER — Ambulatory Visit: Payer: BLUE CROSS/BLUE SHIELD | Attending: Internal Medicine

## 2019-06-24 DIAGNOSIS — Z23 Encounter for immunization: Secondary | ICD-10-CM

## 2019-06-24 NOTE — Progress Notes (Signed)
   Covid-19 Vaccination Clinic  Name:  JEROMY BORCHERDING    MRN: [id number] DOB: July 07, 1985  06/24/2019  Mr. Minney was observed post Covid-19 immunization for 15 minutes without incident. He was provided with Vaccine Information Sheet and instruction to access the V-Safe system.   Mr. Dimond was instructed to call 911 with any severe reactions post vaccine: Marland Kitchen Difficulty breathing  . Swelling of face and throat  . A fast heartbeat  . A bad rash all over body  . Dizziness and weakness   Immunizations Administered    Name Date Dose VIS Date Route   Moderna COVID-19 Vaccine 06/24/2019  9:20 AM 0.5 mL 03/22/2019 Intramuscular   Manufacturer: Moderna   Lot: [id number]   NDC: 59977-414-23

## 2019-07-26 ENCOUNTER — Ambulatory Visit: Payer: Self-pay | Attending: Internal Medicine

## 2019-07-26 DIAGNOSIS — Z23 Encounter for immunization: Secondary | ICD-10-CM

## 2019-07-26 NOTE — Progress Notes (Signed)
   Covid-19 Vaccination Clinic  Name:  Jerome Glover    MRN: [id number] DOB: [birth year]  07/26/2019  Jerome Glover was observed post Covid-19 immunization for 15 minutes without incident. He was provided with Vaccine Information Sheet and instruction to access the V-Safe system.   Jerome Glover was instructed to call 911 with any severe reactions post vaccine: Marland Kitchen Difficulty breathing  . Swelling of face and throat  . A fast heartbeat  . A bad rash all over body  . Dizziness and weakness   Immunizations Administered    Name Date Dose VIS Date Route   Moderna COVID-19 Vaccine 07/26/2019 10:07 AM 0.5 mL 03/22/2019 Intramuscular   Manufacturer: Moderna   Lot: [id number]   NDC: 96728-979-15

## 2020-01-25 IMAGING — CT CT RENAL STONE PROTOCOL
2 of 4 series · 17 of 46 positions shown, 19 images · non-contrast
Comparison: None.

CLINICAL DATA: Left flank pain

EXAM:
CT ABDOMEN AND PELVIS WITHOUT CONTRAST
TECHNIQUE: Multidetector CT imaging of the abdomen and pelvis was performed
following the standard protocol without IV contrast.

[Series 2: axial st · axial · 0.89mm/px · z∈[-387,+53]mm · 14 of 97 slices shown, 16 images]
[im 5/97  soft-tissue]
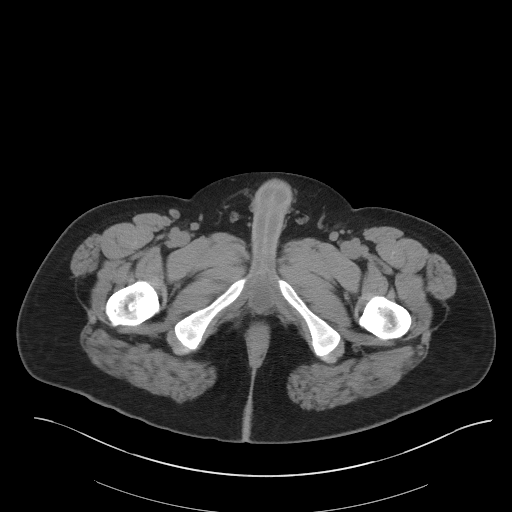
[im 5/97  bone]
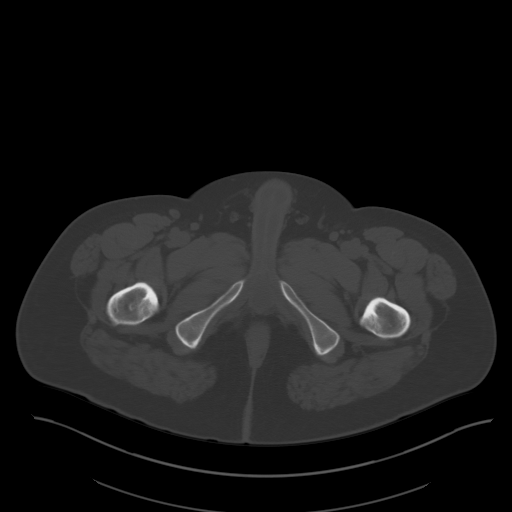
[im 13/97  soft-tissue]
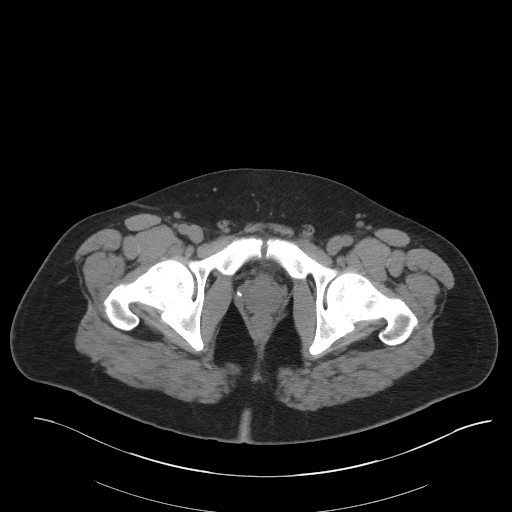
[im 21/97  soft-tissue]
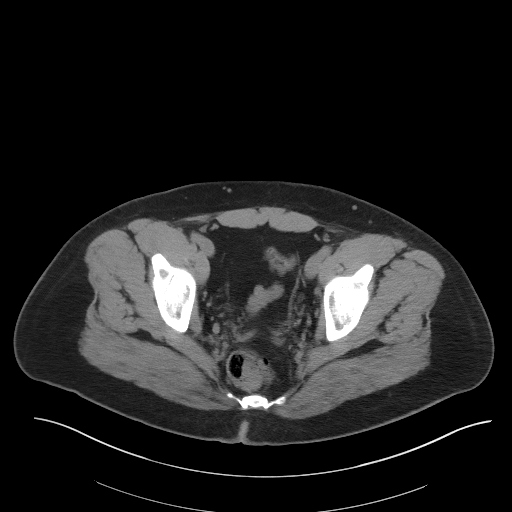
[im 25/97  soft-tissue]
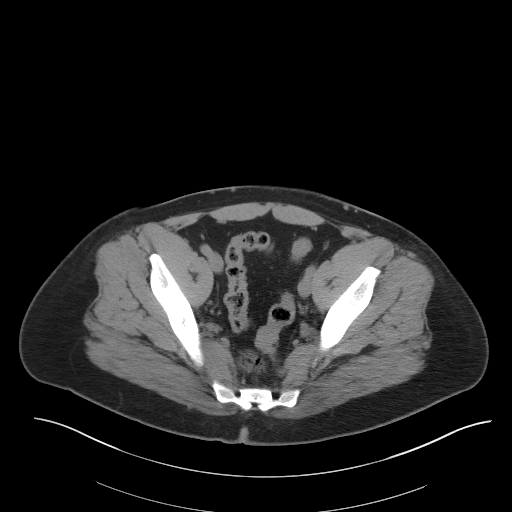
[im 33/97  soft-tissue]
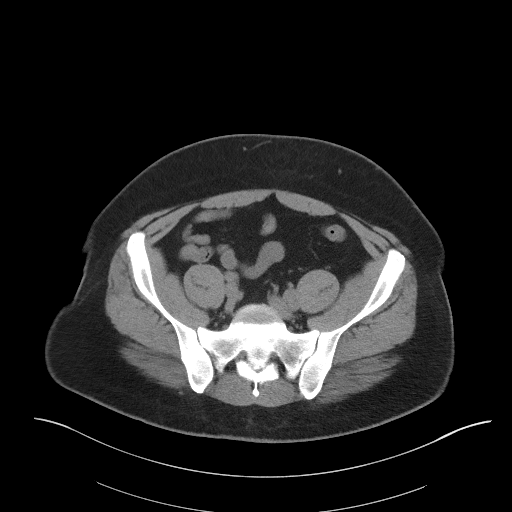
[im 41/97  soft-tissue]
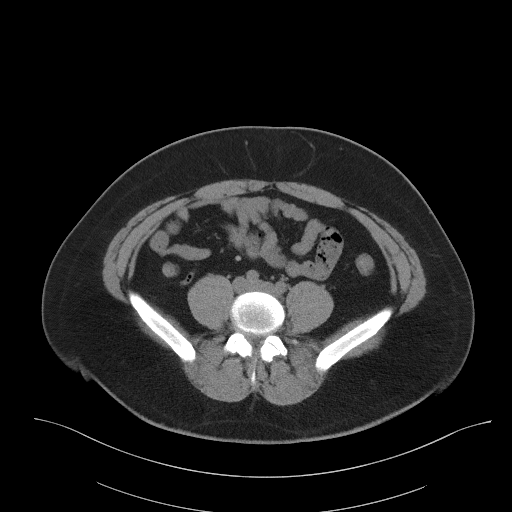
[im 45/97  soft-tissue]
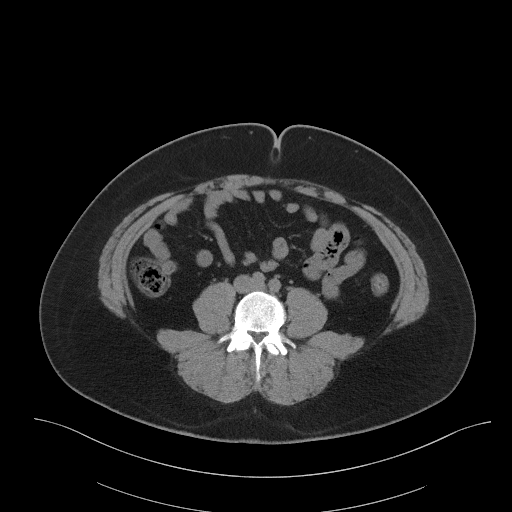
[im 53/97  soft-tissue]
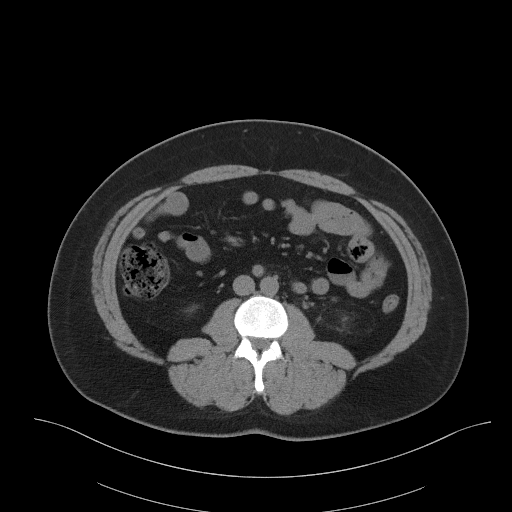
[im 57/97  soft-tissue]
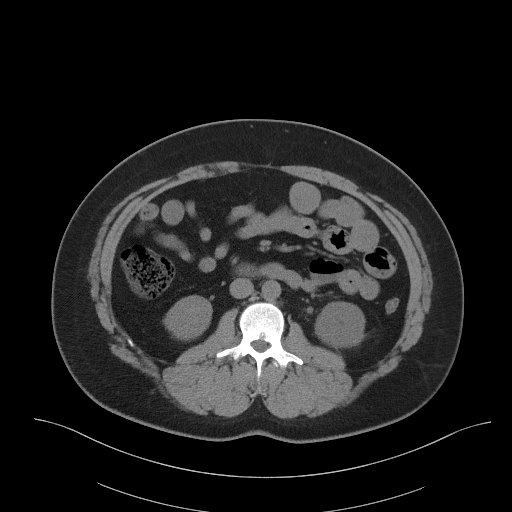
[im 57/97  bone]
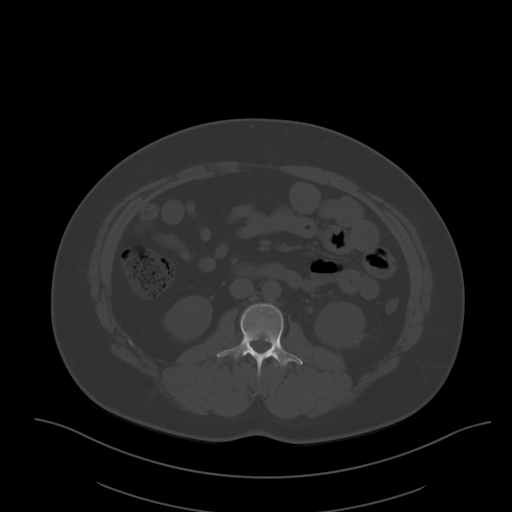
[im 65/97  soft-tissue]
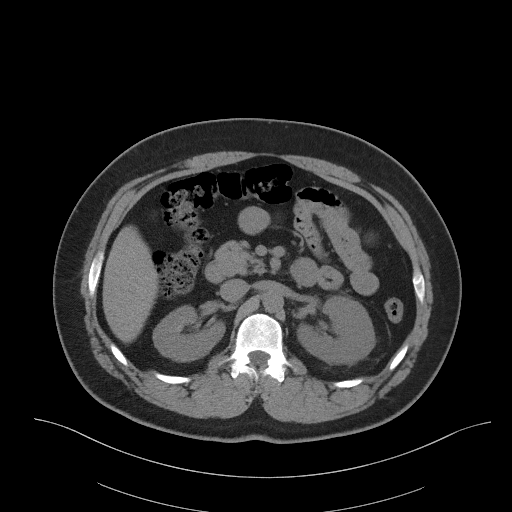
[im 73/97  soft-tissue]
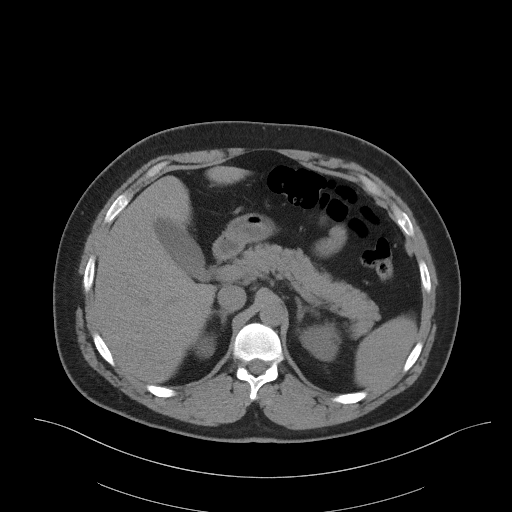
[im 77/97  soft-tissue]
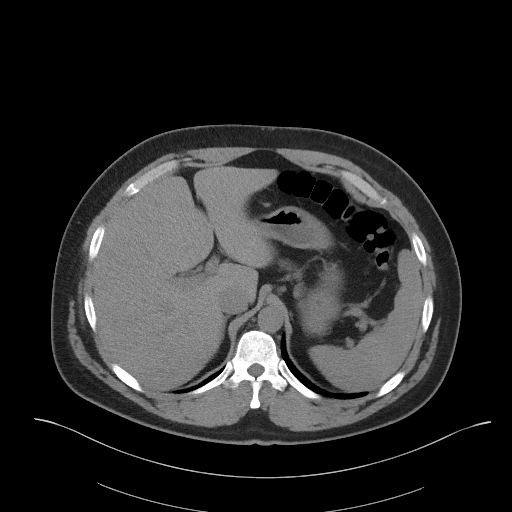
[im 85/97  soft-tissue]
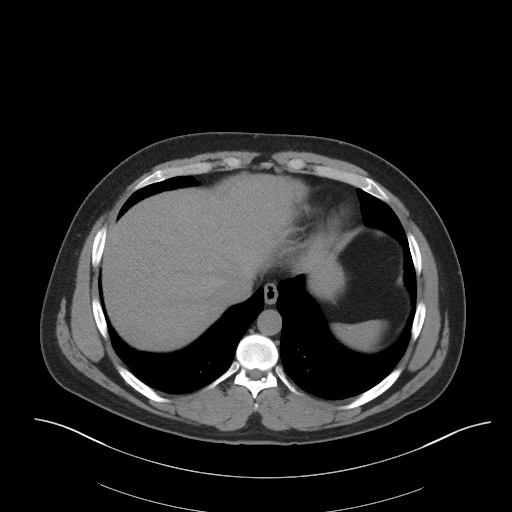
[im 93/97  soft-tissue]
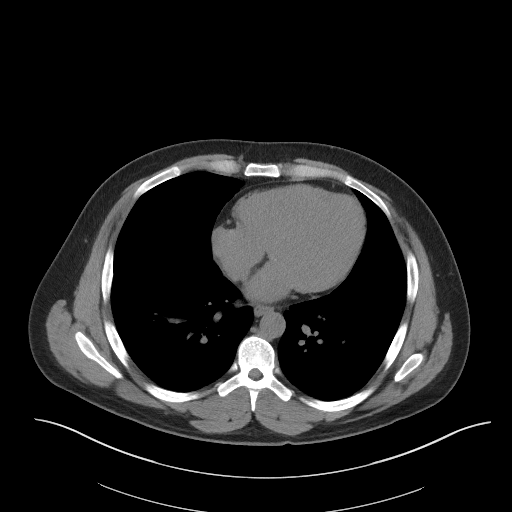

[Series 5: coronal st · coronal · 0.81mm/px · 3 of 90 slices shown]
[im 30/90  soft-tissue]
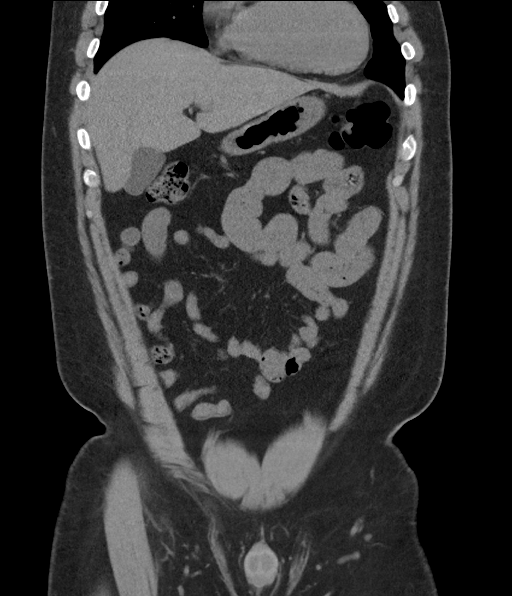
[im 40/90  soft-tissue]
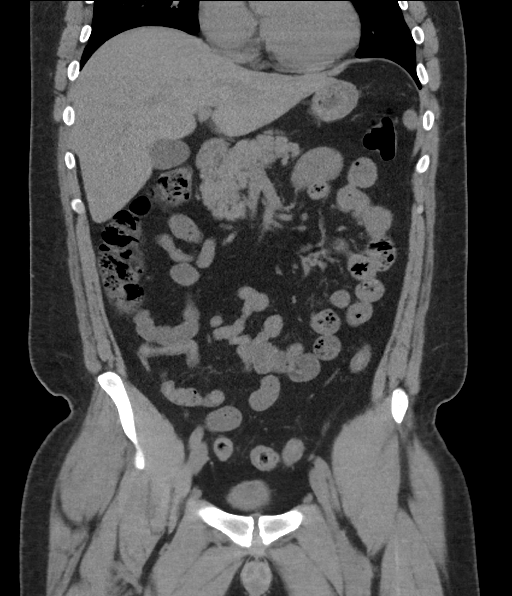
[im 50/90  soft-tissue]
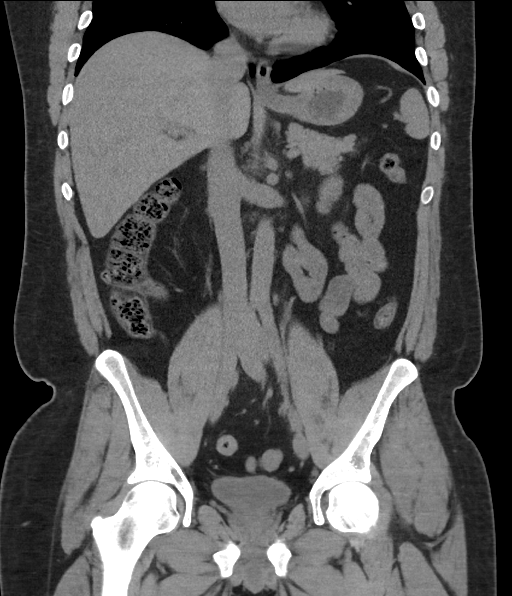

[17 of 46 positions shown; findings below may reference images not displayed]

FINDINGS: Lower chest: Mild dependent atelectasis in the bilateral lower
lobes.

Hepatobiliary: Unenhanced liver is unremarkable.

Gallbladder is unremarkable. No intrahepatic or extrahepatic ductal
dilatation.

Pancreas: Within normal limits.

Spleen: Within normal limits.

Adrenals/Urinary Tract: Adrenal glands within normal limits.

Right kidney is within normal limits. Mild left perirenal edema with
perinephric stranding. Associated mild left hydronephrosis.

3 mm proximal left ureteral calculus at the L2-3 level (coronal
image 57).

Bladder is underdistended but unremarkable.

Stomach/Bowel: Stomach is within normal limits.

No evidence of bowel obstruction.

Normal appendix (series 2/image 57).

Vascular/Lymphatic: No evidence of abdominal aortic aneurysm.

No suspicious abdominopelvic lymphadenopathy.

Reproductive: Prostate is unremarkable.

Other: No abdominopelvic ascites.

Musculoskeletal: Visualized osseous structures are within normal
limits.
IMPRESSION: 3 mm proximal left ureteral calculus at the L2-3 level. Associated
mild left hydronephrosis.

## 2020-03-11 IMAGING — DX DG CHEST 2V
2 series · 2 of 2 positions shown · non-contrast
Comparison: 04/04/2008

CLINICAL DATA: Tachycardia

EXAM:
CHEST - 2 VIEW

[chest lat]
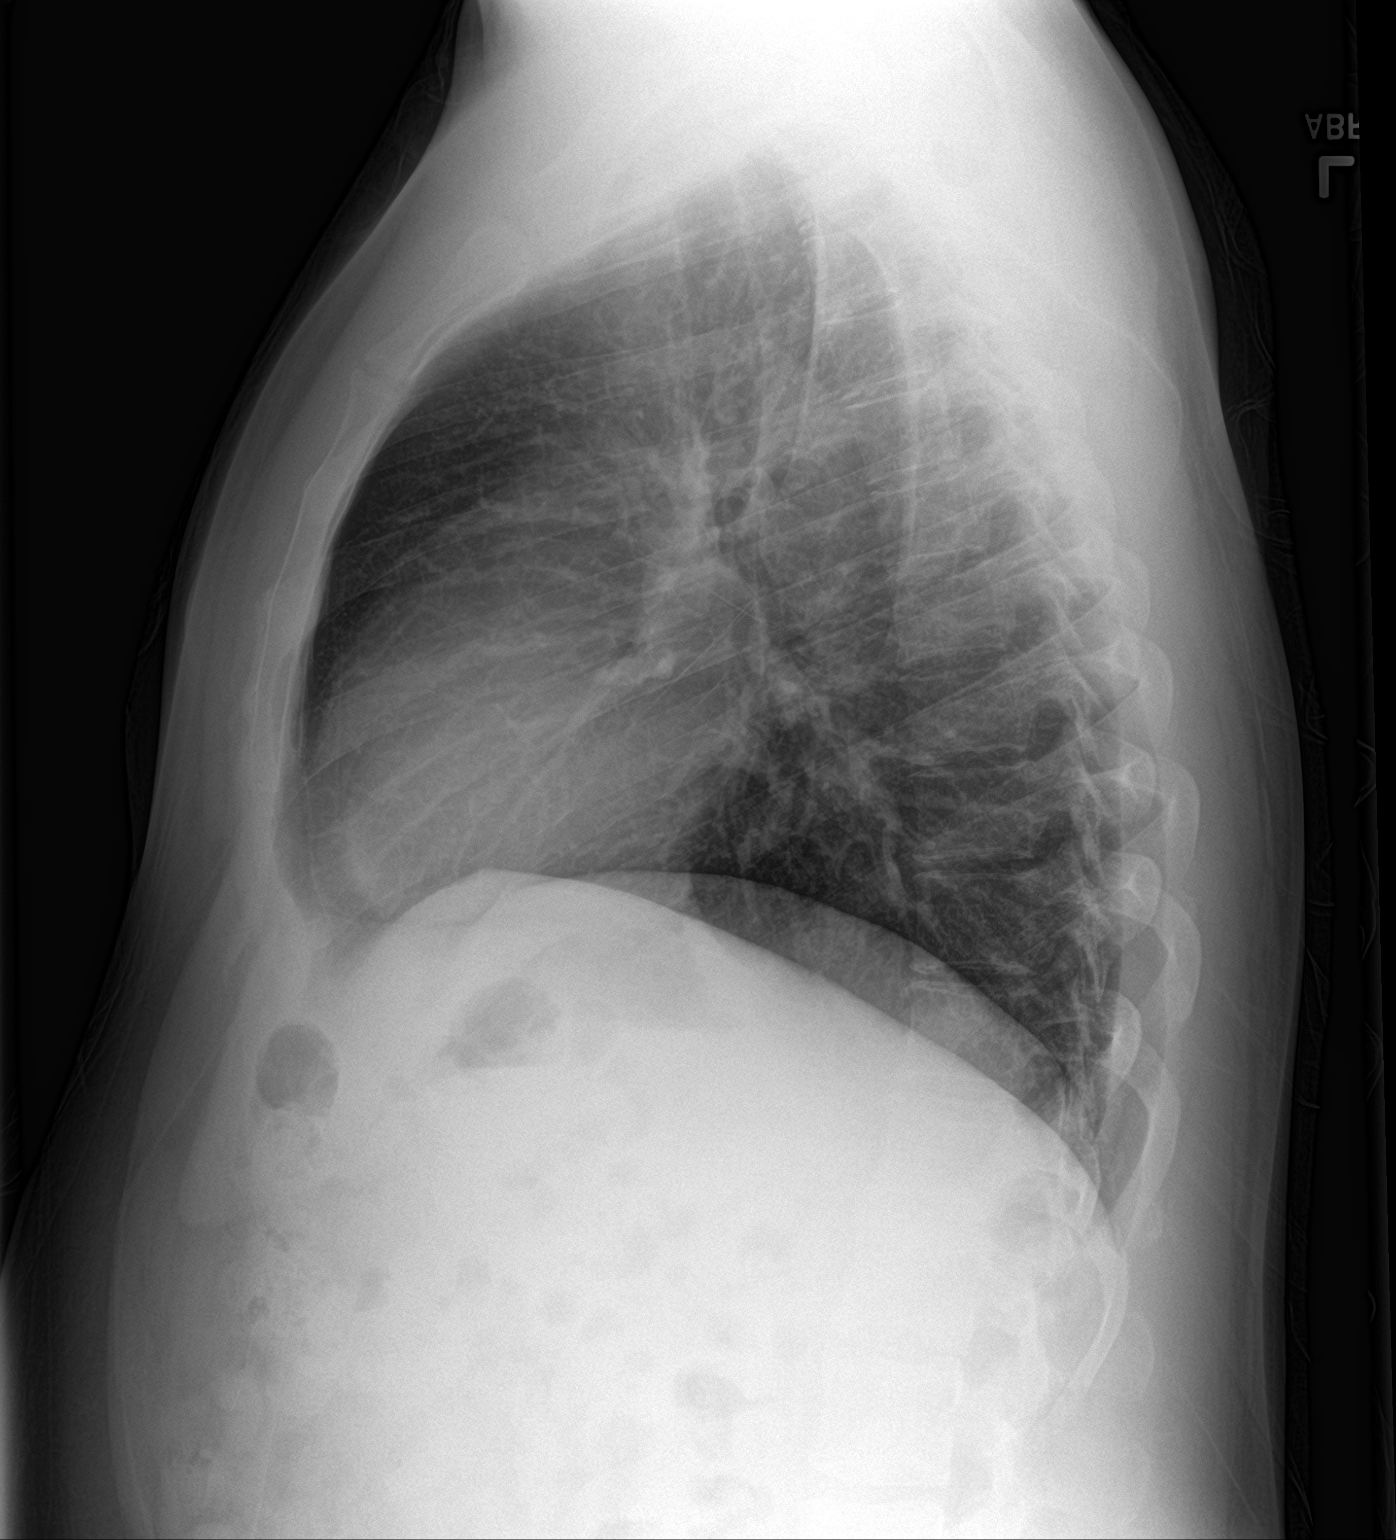

[chest pa]
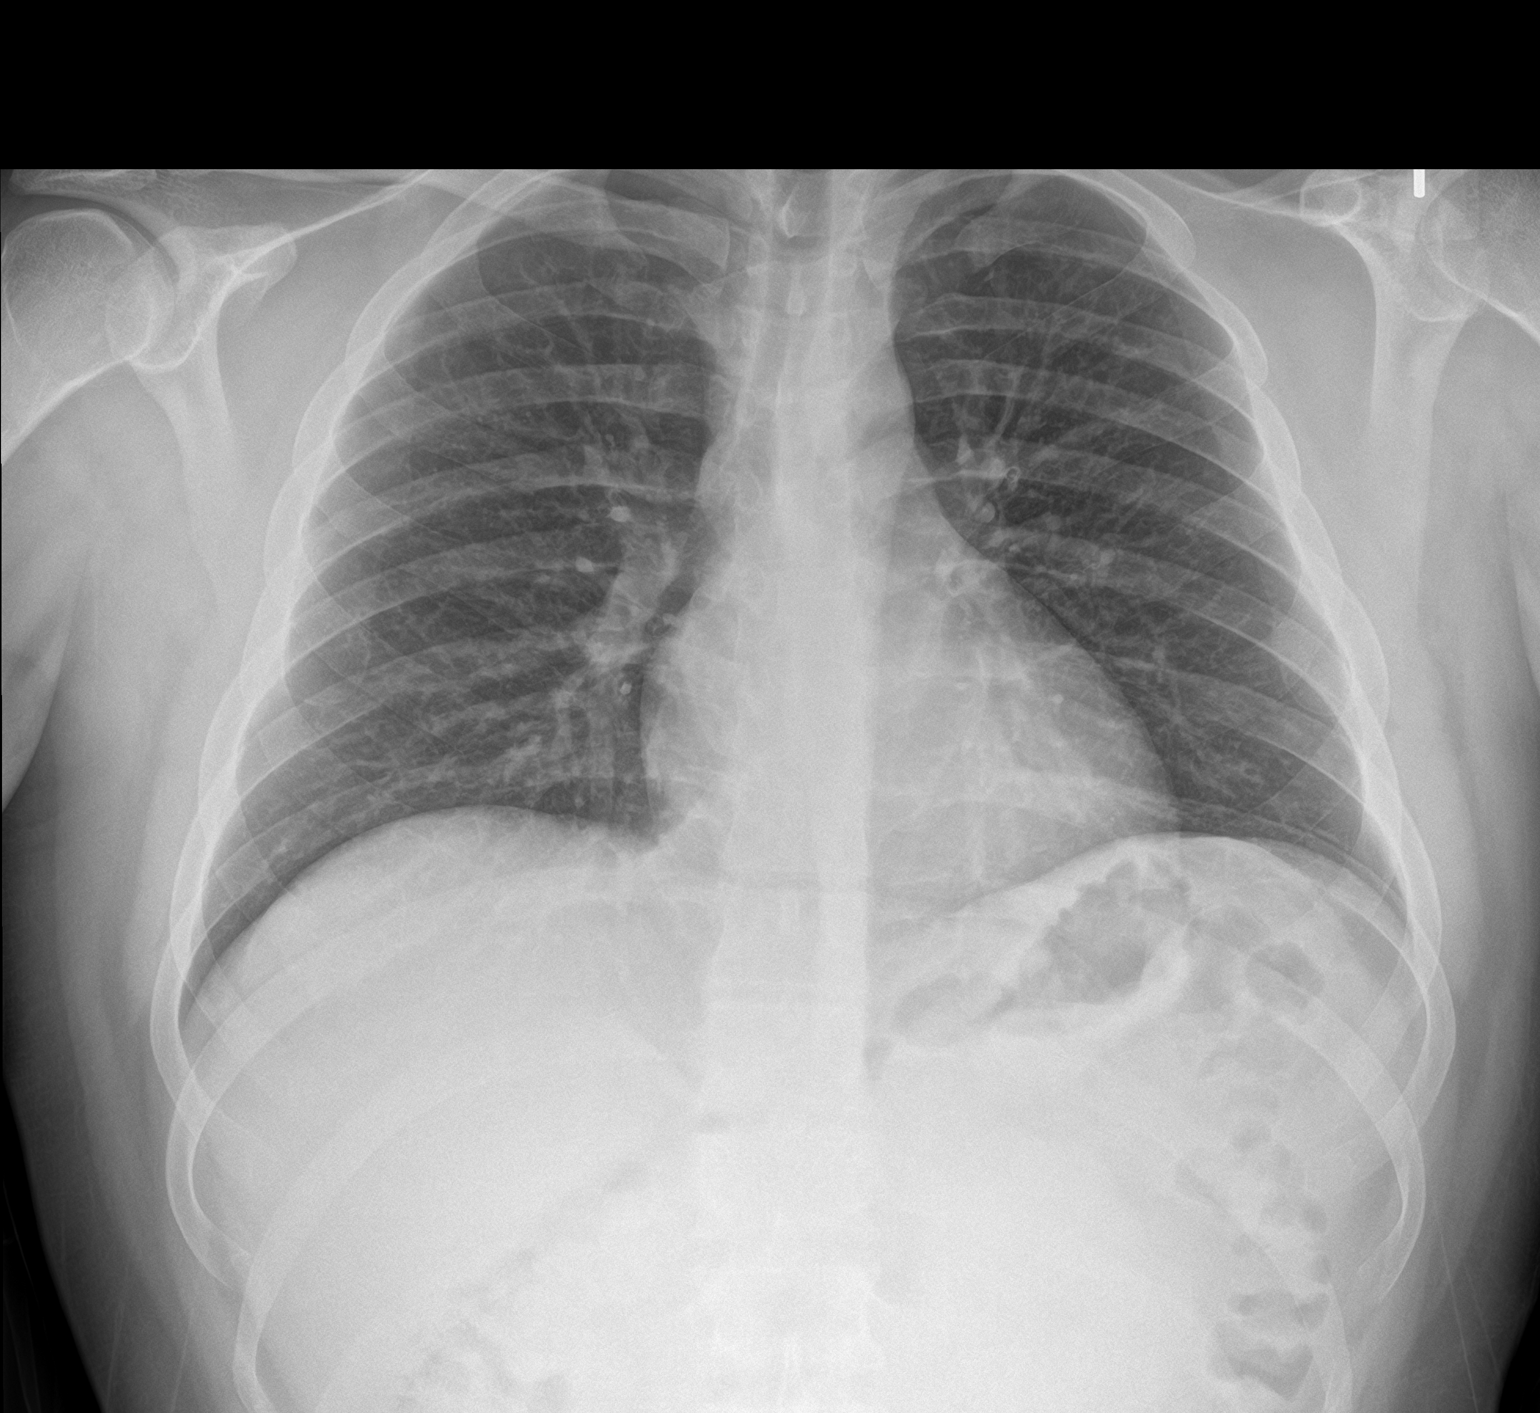

[2 of 2 positions shown; findings below may reference images not displayed]

FINDINGS: Heart size is normal. Mediastinal shadows are normal. The lungs are
clear. No bronchial thickening. No infiltrate, mass, effusion or
collapse. Pulmonary vascularity is normal. No bony abnormality.
IMPRESSION: Normal chest

## 2023-03-25 ENCOUNTER — Encounter: Payer: Self-pay | Admitting: Cardiology

## 2023-03-25 ENCOUNTER — Ambulatory Visit: Payer: Managed Care, Other (non HMO) | Attending: Cardiology | Admitting: Cardiology

## 2023-03-25 VITALS — BP 126/74 | HR 64 | Ht 67.0 in | Wt 208.0 lb

## 2023-03-25 DIAGNOSIS — R079 Chest pain, unspecified: Secondary | ICD-10-CM | POA: Diagnosis not present

## 2023-03-25 DIAGNOSIS — Z8249 Family history of ischemic heart disease and other diseases of the circulatory system: Secondary | ICD-10-CM

## 2023-03-25 NOTE — Patient Instructions (Signed)
Medication Instructions:  Your physician recommends that you continue on your current medications as directed. Please refer to the Current Medication list given to you today.  *If you need a refill on your cardiac medications before your next appointment, please call your pharmacy*   Lab Work: None If you have labs (blood work) drawn today and your tests are completely normal, you will receive your results only by: MyChart Message (if you have MyChart) OR A paper copy in the mail If you have any lab test that is abnormal or we need to change your treatment, we will call you to review the results.   Testing/Procedures: Your physician has requested that you have an echocardiogram. Echocardiography is a painless test that uses sound waves to create images of your heart. It provides your doctor with information about the size and shape of your heart and how well your heart's chambers and valves are working. This procedure takes approximately one hour. There are no restrictions for this procedure. Please do NOT wear cologne, perfume, aftershave, or lotions (deodorant is allowed). Please arrive 15 minutes prior to your appointment time.  Please note: We ask at that you not bring children with you during ultrasound (echo/ vascular) testing. Due to room size and safety concerns, children are not allowed in the ultrasound rooms during exams. Our front office staff cannot provide observation of children in our lobby area while testing is being conducted. An adult accompanying a patient to their appointment will only be allowed in the ultrasound room at the discretion of the ultrasound technician under special circumstances. We apologize for any inconvenience.    Follow-Up: At [facility], you and your health needs are our priority.  As part of our continuing mission to provide you with exceptional heart care, we have created designated Provider Care Teams.  These Care Teams include your  primary Cardiologist (physician) and Advanced Practice Providers (APPs -  Physician Assistants and Nurse Practitioners) who all work together to provide you with the care you need, when you need it.  We recommend signing up for the patient portal called "MyChart".  Sign up information is provided on this After Visit Summary.  MyChart is used to connect with patients for Virtual Visits (Telemedicine).  Patients are able to view lab/test results, encounter notes, upcoming appointments, etc.  Non-urgent messages can be sent to your provider as well.   To learn more about what you can do with MyChart, go to [email or web address].    Your next appointment:   3 month(s)  Provider:   You may see Dina Rich, MD or one of the following Advanced Practice Providers on your designated Care Team:   Randall An, PA-C  Jacolyn Reedy, [place]     Other Instructions

## 2023-03-25 NOTE — Progress Notes (Signed)
Clinical Summary [name] is a 37 y.o.male referred by PA Loreta Ave for family history of hypertrophic cardiomyopathy  Family history of hypertrophic cardiomyopathy - history in both his father (patient of mine Jerome Glover) and aunt of HOCM - his father is a patient of mine with extensive history of HOCM including cardiac arrest requiring AICD.    - some chest pains. Midchest, pressure like feeling. 6/10 in severity. Some SOB, can feel hot. Not positional - typically occurs with high levels activity.  - 2 total episodes over 2 months.  - no lightheadedness, no dizziness - no specific DOE -no relation to food  CAD risk factors: HTN, smoker x 20 years     2.HTN - compliant with meds   Past Medical History:  Diagnosis Date   Hypertension      Not on File   Current Outpatient Medications  Medication Sig Dispense Refill   BIOTIN PO Take 1 capsule by mouth daily.     cetirizine (ZYRTEC) 10 MG tablet Take 10 mg by mouth daily.     cyclobenzaprine (FLEXERIL) 10 MG tablet Take 10 mg by mouth 3 (three) times daily as needed.     ibuprofen (ADVIL,MOTRIN) 200 MG tablet Take 800 mg by mouth every 6 (six) hours as needed for mild pain or moderate pain.     lisinopril (ZESTRIL) 5 MG tablet Take 5 mg by mouth daily.     meloxicam (MOBIC) 15 MG tablet Take 15 mg by mouth daily.     ondansetron (ZOFRAN ODT) 4 MG disintegrating tablet Take 1 tablet (4 mg total) by mouth every 8 (eight) hours as needed. 10 tablet 0   oxyCODONE-acetaminophen (PERCOCET/ROXICET) 5-325 MG tablet Take 1 tablet by mouth every 4 (four) hours as needed for severe pain. (Patient not taking: Reported on 03/25/2023) 15 tablet 0   scopolamine (TRANSDERM-SCOP) 1 MG/3DAYS Place 1 patch onto the skin every 3 (three) days.  (Patient not taking: Reported on 03/25/2023)  10   tamsulosin (FLOMAX) 0.4 MG CAPS capsule Take 1 capsule (0.4 mg total) by mouth daily. (Patient not taking: Reported on 03/25/2023) 14 capsule  0   No current facility-administered medications for this visit.     Past Surgical History:  Procedure Laterality Date   HEMORRHOID SURGERY N/A 10/12/2015   Procedure: EXTENSIVE HEMORRHOIDECTOMY;  Surgeon: Ancil Linsey, MD;  Location: [facility];  Service: General;  Laterality: N/A;   TONSILLECTOMY       Not on File    Family History  Problem Relation Age of Onset   Hypertension Mother    Diabetes Mother    Hypertension Father    Diabetes Father      Social History [name] reports that he has been smoking cigarettes. He has a 7.5 pack-year smoking history. He has quit using smokeless tobacco. [name] reports current alcohol use.   Review of Systems CONSTITUTIONAL: No weight loss, fever, chills, weakness or fatigue.  HEENT: Eyes: No visual loss, blurred vision, double vision or yellow sclerae.No hearing loss, sneezing, congestion, runny nose or sore throat.  SKIN: No rash or itching.  CARDIOVASCULAR: per hpi RESPIRATORY: No shortness of breath, cough or sputum.  GASTROINTESTINAL: No anorexia, nausea, vomiting or diarrhea. No abdominal pain or blood.  GENITOURINARY: No burning on urination, no polyuria NEUROLOGICAL: No headache, dizziness, syncope, paralysis, ataxia, numbness or tingling in the extremities. No change in bowel or bladder control.  MUSCULOSKELETAL: No muscle, back pain, joint pain or stiffness.  LYMPHATICS:  No enlarged nodes. No history of splenectomy.  PSYCHIATRIC: No history of depression or anxiety.  ENDOCRINOLOGIC: No reports of sweating, cold or heat intolerance. No polyuria or polydipsia.  Marland Kitchen   Physical Examination Vitals:   03/25/23 1502  BP: 126/74  Pulse: 64  SpO2: 98%   Filed Weights   03/25/23 1502  Weight: 208 lb (94.3 kg)    Gen: resting comfortably, no acute distress HEENT: no scleral icterus, pupils equal round and reactive, no palptable cervical adenopathy,  CV: RRR, no mr/g,  no jvd Resp: Clear to auscultation  bilaterally GI: abdomen is soft, non-tender, non-distended, normal bowel sounds, no hepatosplenomegaly MSK: extremities are warm, no edema.  Skin: warm, no rash Neuro:  no focal deficits Psych: appropriate affect     Assessment and Plan  1.Family history of HOCM - father patient of       Jerome Glover, M.D., F.A.C.C.     Clinical Summary [name] is a 37 y.o.male referred for family history of hypertrophic cardiomyopathy  Family history of hypertrophic cardiomyopathy - history in both his father and aunt of HOCM - his father is a patient of mine with extensive history of HOCM including cardiac arrest requiring AICD.    - some chest pains. Midchest, pressure like feeling. 6/10 in severity. Some SOB, can feel hot. Not positional - typically occurs with high levels activity.  - 2 total episodes over 2 months.  - no lightheadedness, no dizziness - no specific DOE -no relation to food  CAD risk factors: HTN, smoker x 20 years     2.HTN - compliant with meds  No past medical history on file.   Not on File   Current Outpatient Medications  Medication Sig Dispense Refill   BIOTIN PO Take 1 capsule by mouth daily.     cetirizine (ZYRTEC) 10 MG tablet Take 10 mg by mouth daily.     ibuprofen (ADVIL,MOTRIN) 200 MG tablet Take 800 mg by mouth every 6 (six) hours as needed for mild pain or moderate pain.     ondansetron (ZOFRAN ODT) 4 MG disintegrating tablet Take 1 tablet (4 mg total) by mouth every 8 (eight) hours as needed. 10 tablet 0   oxyCODONE-acetaminophen (PERCOCET/ROXICET) 5-325 MG tablet Take 1 tablet by mouth every 4 (four) hours as needed for severe pain. 15 tablet 0   scopolamine (TRANSDERM-SCOP) 1 MG/3DAYS Place 1 patch onto the skin every 3 (three) days.   10   tamsulosin (FLOMAX) 0.4 MG CAPS capsule Take 1 capsule (0.4 mg total) by mouth daily. 14 capsule 0   No current facility-administered medications for this visit.     Past Surgical  History:  Procedure Laterality Date   HEMORRHOID SURGERY N/A 10/12/2015   Procedure: EXTENSIVE HEMORRHOIDECTOMY;  Surgeon: Ancil Linsey, MD;  Location: [facility];  Service: General;  Laterality: N/A;   TONSILLECTOMY       Not on File    No family history on file.   Social History [name] reports that he has been smoking cigarettes. He has a 7.5 pack-year smoking history. He has quit using smokeless tobacco. [name] reports current alcohol use.   Review of Systems CONSTITUTIONAL: No weight loss, fever, chills, weakness or fatigue.  HEENT: Eyes: No visual loss, blurred vision, double vision or yellow sclerae.No hearing loss, sneezing, congestion, runny nose or sore throat.  SKIN: No rash or itching.  CARDIOVASCULAR: per hpi RESPIRATORY: per hpi GASTROINTESTINAL: No anorexia, nausea, vomiting or diarrhea. No abdominal pain  or blood.  GENITOURINARY: No burning on urination, no polyuria NEUROLOGICAL: No headache, dizziness, syncope, paralysis, ataxia, numbness or tingling in the extremities. No change in bowel or bladder control.  MUSCULOSKELETAL: No muscle, back pain, joint pain or stiffness.  LYMPHATICS: No enlarged nodes. No history of splenectomy.  PSYCHIATRIC: No history of depression or anxiety.  ENDOCRINOLOGIC: No reports of sweating, cold or heat intolerance. No polyuria or polydipsia.  Marland Kitchen   Physical Examination Today's Vitals   03/25/23 1502  BP: 126/74  Pulse: 64  SpO2: 98%  Weight: 208 lb (94.3 kg)  Height: 5\' 7"  (1.702 m)   Body mass index is 32.58 kg/m.  Gen: resting comfortably, no acute distress HEENT: no scleral icterus, pupils equal round and reactive, no palptable cervical adenopathy,  CV: RRR, no m/rg, no jvd Resp: Clear to auscultation bilaterally GI: abdomen is soft, non-tender, non-distended, normal bowel sounds, no hepatosplenomegaly MSK: extremities are warm, no edema.  Skin: warm, no rash Neuro:  no focal deficits Psych: appropriate  affect      Assessment and Plan   Family history of HOCM - father and aunt history of HOCM. His father follows in our office, history of prior VT with arrest in setting of HOCM now with AICD - patient reports he has never had prior echo - nonspecific chest pains at times with acitivity. No significant DOE, no syncope history - plan for echo to evaluate cardiac function - if benign echo would discuss with his father genetic testing for the family. If not pursued then patient will need screening echos every 3-5 years   2. Chest pain - unclear etiology - will start workup with echocardiogram - EKG today SR, no ischemic changes.    F/u 3 months   Jerome Glover, M.D.

## 2023-04-13 ENCOUNTER — Ambulatory Visit (HOSPITAL_COMMUNITY)
Admission: RE | Admit: 2023-04-13 | Discharge: 2023-04-13 | Disposition: A | Discharge status: Home / Self Care | Payer: Managed Care, Other (non HMO) | Source: Ambulatory Visit | Attending: Cardiology | Admitting: Cardiology

## 2023-04-13 DIAGNOSIS — R079 Chest pain, unspecified: Secondary | ICD-10-CM | POA: Insufficient documentation

## 2023-04-13 LAB — ECHOCARDIOGRAM COMPLETE
Area-P 1/2: 3.21 cm2
S' Lateral: 2.6 cm

## 2023-04-13 NOTE — Progress Notes (Signed)
*  PRELIMINARY RESULTS* Echocardiogram 2D Echocardiogram has been performed.  Stacey Drain 04/13/2023, 1:55 PM

## 2023-07-29 ENCOUNTER — Encounter: Payer: Self-pay | Admitting: Cardiology

## 2023-07-29 ENCOUNTER — Ambulatory Visit: Payer: Managed Care, Other (non HMO) | Attending: Cardiology | Admitting: Cardiology

## 2023-07-29 VITALS — BP 126/80 | HR 56 | Ht 67.0 in | Wt 207.6 lb

## 2023-07-29 DIAGNOSIS — Z8249 Family history of ischemic heart disease and other diseases of the circulatory system: Secondary | ICD-10-CM | POA: Diagnosis not present

## 2023-07-29 DIAGNOSIS — R0789 Other chest pain: Secondary | ICD-10-CM | POA: Diagnosis not present

## 2023-07-29 NOTE — Progress Notes (Signed)
 Clinical Summary Mr. Bua is a 38 y.o.male seen today for follow up of the following medical problems.   Family history of hypertrophic cardiomyopathy - history in both his father (patient of mine Fenton Candee) and aunt of HOCM - his father is a patient of mine with extensive history of HOCM including cardiac arrest requiring AICD.  - we had placed referral for genetic testing however family was tied up with other appointments and were not able to schedule, we have placed the referral again.    03/2023 echo: LVEF 70-75%, no WMAs, mild LVH - at prior visit had reported some chest pains but these have resolved. - denies any significant SOB/DOE     2.HTN - compliant with meds Past Medical History:  Diagnosis Date   Hypertension      Not on File   Current Outpatient Medications  Medication Sig Dispense Refill   BIOTIN PO Take 1 capsule by mouth daily.     cetirizine (ZYRTEC) 10 MG tablet Take 10 mg by mouth daily.     cyclobenzaprine (FLEXERIL) 10 MG tablet Take 10 mg by mouth 3 (three) times daily as needed.     ibuprofen (ADVIL,MOTRIN) 200 MG tablet Take 800 mg by mouth every 6 (six) hours as needed for mild pain or moderate pain.     lisinopril (ZESTRIL) 5 MG tablet Take 5 mg by mouth daily.     meloxicam (MOBIC) 15 MG tablet Take 15 mg by mouth daily.     ondansetron (ZOFRAN ODT) 4 MG disintegrating tablet Take 1 tablet (4 mg total) by mouth every 8 (eight) hours as needed. 10 tablet 0   oxyCODONE-acetaminophen (PERCOCET/ROXICET) 5-325 MG tablet Take 1 tablet by mouth every 4 (four) hours as needed for severe pain. (Patient not taking: Reported on 03/25/2023) 15 tablet 0   scopolamine (TRANSDERM-SCOP) 1 MG/3DAYS Place 1 patch onto the skin every 3 (three) days.  (Patient not taking: Reported on 03/25/2023)  10   tamsulosin (FLOMAX) 0.4 MG CAPS capsule Take 1 capsule (0.4 mg total) by mouth daily. (Patient not taking: Reported on 03/25/2023) 14 capsule 0   No  current facility-administered medications for this visit.     Past Surgical History:  Procedure Laterality Date   HEMORRHOID SURGERY N/A 10/12/2015   Procedure: EXTENSIVE HEMORRHOIDECTOMY;  Surgeon: Ancil Linsey, MD;  Location: [facility];  Service: General;  Laterality: N/A;   TONSILLECTOMY       Not on File    Family History  Problem Relation Age of Onset   Hypertension Mother    Diabetes Mother    Hypertension Father    Diabetes Father      Social History Mr. Silas reports that he has been smoking cigarettes. He has a 7.5 pack-year smoking history. He has quit using smokeless tobacco. Mr. Menna reports current alcohol use.    Physical Examination Today's Vitals   07/29/23 1054  BP: 126/80  Pulse: (!) 56  SpO2: 98%  Weight: 207 lb 9.6 oz (94.2 kg)  Height: 5\' 7"  (1.702 m)  PainSc: 0-No pain   Body mass index is 32.51 kg/m.  Gen: resting comfortably, no acute distress HEENT: no scleral icterus, pupils equal round and reactive, no palptable cervical adenopathy,  CV: RRR, no m/rg, no jvd Resp: Clear to auscultation bilaterally GI: abdomen is soft, non-tender, non-distended, normal bowel sounds, no hepatosplenomegaly MSK: extremities are warm, no edema.  Skin: warm, no rash Neuro:  no focal deficits Psych: appropriate affect  Diagnostic Studies  03/2023 echo  1. Left ventricular ejection fraction, by estimation, is 70 to 75%. The  left ventricle has hyperdynamic function. The left ventricle has no  regional wall motion abnormalities. There is mild concentric left  ventricular hypertrophy. Left ventricular  diastolic parameters were normal. There is mitral chordal SAM, but no  obvious LVOT gradient at rest.   2. Right ventricular systolic function is normal. The right ventricular  size is normal. Tricuspid regurgitation signal is inadequate for assessing  PA pressure.   3. The mitral valve is grossly normal. Trivial mitral valve  regurgitation.    4. The aortic valve is tricuspid. Aortic valve regurgitation is not  visualized.   5. The inferior vena cava is normal in size with greater than 50%  respiratory variability, suggesting right atrial pressure of 3 mmHg.     Assessment and Plan  1.Family history of HOCM - benign echo 03/2023 - his father has HOCM and is a patient of mine, the family has been referred for genetic testing/counseling, we will f/u results  2. Chest pain - benign echo - symptoms have resolved, no plans for ischemic testing at this time. Continue to monitor  F/u 1 year .     Antoine Poche, M.D.,

## 2023-07-29 NOTE — Patient Instructions (Signed)

## 2023-12-08 ENCOUNTER — Ambulatory Visit (INDEPENDENT_AMBULATORY_CARE_PROVIDER_SITE_OTHER)

## 2023-12-08 ENCOUNTER — Ambulatory Visit
Admission: EM | Admit: 2023-12-08 | Discharge: 2023-12-08 | Disposition: A | Discharge status: Home / Self Care | Attending: Nurse Practitioner | Admitting: Nurse Practitioner

## 2023-12-08 DIAGNOSIS — R319 Hematuria, unspecified: Secondary | ICD-10-CM | POA: Insufficient documentation

## 2023-12-08 DIAGNOSIS — Z87442 Personal history of urinary calculi: Secondary | ICD-10-CM | POA: Insufficient documentation

## 2023-12-08 DIAGNOSIS — N200 Calculus of kidney: Secondary | ICD-10-CM | POA: Insufficient documentation

## 2023-12-08 LAB — POCT URINE DIPSTICK
Glucose, UA: NEGATIVE mg/dL
Leukocytes, UA: NEGATIVE
Nitrite, UA: NEGATIVE
POC PROTEIN,UA: 30 — AB
Spec Grav, UA: 1.025 (ref 1.010–1.025)
Urobilinogen, UA: 2 U/dL — AB
pH, UA: 6 (ref 5.0–8.0)

## 2023-12-08 MED ORDER — TAMSULOSIN HCL 0.4 MG PO CAPS: 0.4000 mg | ORAL_CAPSULE | Freq: Every day | ORAL | 0 refills | Status: DC

## 2023-12-08 MED ORDER — ACETAMINOPHEN ER 650 MG PO TBCR: 650.0000 mg | EXTENDED_RELEASE_TABLET | Freq: Three times a day (TID) | ORAL | 0 refills | Status: AC | PRN

## 2023-12-08 NOTE — ED Provider Notes (Signed)
 [facility]    CSN: [id number] Arrival date & time: 12/08/23  1915      History   Chief Complaint Chief Complaint  Patient presents with   Hematuria    HPI Jerome Glover is a 38 y.o. male.   The history is provided by the patient and the spouse.   Patient presents for complaints of hematuria that started over the past hour.  Patient states that he urinated blood 1 time.  Patient states that over the past 2 to 3 days, he has also noticed some pain in his right side.  Patient denies fever, chills, urinary frequency, urgency, hesitancy, decreased urine stream, dysuria, or flank pain.  Patient endorses prior history of kidney stones, states his last episode was on the left side.  Patient's spouse states that patient did see his PCP and his creatinine was slightly elevated.  States patient planning to have his blood work repeated before he follows up with his PCP.  Patient denies pain at present.  Past Medical History:  Diagnosis Date   Hypertension     There are no active problems to display for this patient.   Past Surgical History:  Procedure Laterality Date   HEMORRHOID SURGERY N/A 10/12/2015   Procedure: EXTENSIVE HEMORRHOIDECTOMY;  Surgeon: Selinda Artist Moats, MD;  Location: [facility];  Service: General;  Laterality: N/A;   TONSILLECTOMY         Home Medications    Prior to Admission medications   Medication Sig Start Date End Date Taking? Authorizing Provider  acetaminophen  (TYLENOL  8 HOUR) 650 MG CR tablet Take 1 tablet (650 mg total) by mouth every 8 (eight) hours as needed for pain. 12/08/23  Yes Leath-Warren, Etta PARAS, NP  tamsulosin  (FLOMAX ) 0.4 MG CAPS capsule Take 1 capsule (0.4 mg total) by mouth daily after supper. 12/08/23  Yes Leath-Warren, Etta PARAS, NP  BIOTIN PO Take 1 capsule by mouth daily.    [provider]  cetirizine (ZYRTEC) 10 MG tablet Take 10 mg by mouth daily.    [provider]  cyclobenzaprine (FLEXERIL) 10 MG  tablet Take 10 mg by mouth 3 (three) times daily as needed. 10/16/22   [provider]  ibuprofen (ADVIL,MOTRIN) 200 MG tablet Take 800 mg by mouth every 6 (six) hours as needed for mild pain or moderate pain.    [provider]  lisinopril (ZESTRIL) 5 MG tablet Take 5 mg by mouth daily. 01/08/23   [provider]  meloxicam (MOBIC) 15 MG tablet Take 15 mg by mouth daily.    [provider]  ondansetron  (ZOFRAN  ODT) 4 MG disintegrating tablet Take 1 tablet (4 mg total) by mouth every 8 (eight) hours as needed. 01/16/18   Dean Clarity, MD  oxyCODONE -acetaminophen  (PERCOCET/ROXICET) 5-325 MG tablet Take 1 tablet by mouth every 4 (four) hours as needed for severe pain. Patient not taking: Reported on 03/25/2023 01/16/18   Haviland, Julie, MD  scopolamine (TRANSDERM-SCOP) 1 MG/3DAYS Place 1 patch onto the skin every 3 (three) days.  Patient not taking: Reported on 03/25/2023 01/14/18   [provider]    Family History Family History  Problem Relation Age of Onset   Hypertension Mother    Diabetes Mother    Hypertension Father    Diabetes Father     Social History Social History   Tobacco Use   Smoking status: Every Day    Current packs/day: 0.50    Average packs/day: 0.5 packs/day for 15.0 years (7.5 ttl  pk-yrs)    Types: Cigarettes   Smokeless tobacco: Former  Building services engineer status: Former  Substance Use Topics   Alcohol use: Yes    Comment: rare   Drug use: No     Allergies   Patient has no known allergies.   Review of Systems Review of Systems Per HPI  Physical Exam Triage Vital Signs ED Triage Vitals [12/08/23 1931]  Encounter Vitals Group     BP (!) 145/84     Girls Systolic BP Percentile      Girls Diastolic BP Percentile      Boys Systolic BP Percentile      Boys Diastolic BP Percentile      Pulse Rate (!) 59     Resp 16     Temp 97.8 F (36.6 C)     Temp Source Oral     SpO2 98 %     Weight      Height       Head Circumference      Peak Flow      Pain Score 0     Pain Loc      Pain Education      Exclude from Growth Chart    No data found.  Updated Vital Signs BP (!) 145/84 (BP Location: Right Arm)   Pulse (!) 59   Temp 97.8 F (36.6 C) (Oral)   Resp 16   SpO2 98%   Visual Acuity Right Eye Distance:   Left Eye Distance:   Bilateral Distance:    Right Eye Near:   Left Eye Near:    Bilateral Near:     Physical Exam Vitals and nursing note reviewed.  Constitutional:      General: He is not in acute distress.    Appearance: Normal appearance.  HENT:     Head: Normocephalic.  Eyes:     Extraocular Movements: Extraocular movements intact.     Conjunctiva/sclera: Conjunctivae normal.     Pupils: Pupils are equal, round, and reactive to light.  Cardiovascular:     Rate and Rhythm: Normal rate and regular rhythm.     Pulses: Normal pulses.     Heart sounds: Normal heart sounds.  Pulmonary:     Effort: Pulmonary effort is normal. No respiratory distress.     Breath sounds: Normal breath sounds. No stridor. No wheezing, rhonchi or rales.  Abdominal:     General: Bowel sounds are normal.     Palpations: Abdomen is soft.     Tenderness: There is no abdominal tenderness. There is right CVA tenderness.  Musculoskeletal:     Cervical back: Normal range of motion.  Skin:    General: Skin is warm and dry.  Neurological:     General: No focal deficit present.     Mental Status: He is alert and oriented to person, place, and time.  Psychiatric:        Mood and Affect: Mood normal.        Behavior: Behavior normal.      UC Treatments / Results  Labs (all labs ordered are listed, but only abnormal results are displayed) Labs Reviewed  POCT URINE DIPSTICK - Abnormal; Notable for the following components:      Result Value   Color, UA straw (*)    Clarity, UA cloudy (*)    Bilirubin, UA small (*)    Ketones, POC UA trace (5) (*)    Blood, UA large (*)  POC  PROTEIN,UA =30 (*)    Urobilinogen, UA 2.0 (*)    All other components within normal limits  URINE CULTURE    EKG   Radiology DG Abd 2 Views Result Date: 12/08/2023 CLINICAL DATA:  Hematuria.  Right CVA tenderness. EXAM: ABDOMEN - 2 VIEW COMPARISON:  CT abdomen pelvis dated 01/16/2018. FINDINGS: Punctate radiopaque focus over the right flank may represent a kidney stone. CT may provide better evaluation. Evaluation for kidney stones is limited due to superimposed colonic stool burden. Several pelvic phlebolith as seen on the prior CT. Additional new calcifications in the pelvis may represent new phleboliths or distal ureteral/bladder calculi. No bowel dilatation or evidence of obstruction. No free air. No acute osseous pathology. IMPRESSION: 1. Possible punctate right kidney stone. CT may provide better evaluation. 2. New calcifications in the pelvis may represent new phleboliths or distal ureteral/bladder calculi. Electronically Signed   By: Vanetta Chou M.D.   On: 12/08/2023 20:06    Procedures Procedures (including critical care time)  Medications Ordered in UC Medications - No data to display  Initial Impression / Assessment and Plan / UC Course  I have reviewed the triage vital signs and the nursing notes.  Pertinent labs & imaging results that were available during my care of the patient were reviewed by me and considered in my medical decision making (see chart for details).  X-ray of the abdomen shows a right kidney stone and possible calculi in the distal ureteral/bladder area.  Urinalysis was performed which was positive for blood, urine culture is pending.  On exam, the patient did have right CVA tenderness.  Will treat patient for kidney stones with tamsulosin  0.4 mg and Tylenol  650 mg for pain.  Supportive care recommendations were provided discussed with the patient to include fluids, developing a toileting schedule, and strict ER follow-up precautions.  Patient advised to  follow-up with his PCP to discuss findings and to determine if referral to urology is necessary.  Patient was in agreement with this plan of care and verbalizes understanding.  All questions were answered.  Patient stable for discharge.  Final Clinical Impressions(s) / UC Diagnoses   Final diagnoses:  Hematuria, unspecified type  History of kidney stones  Right kidney stone     Discharge Instructions      The x-ray of your abdomen does show a possible right kidney stone and in your distal ureteral/bladder..  A CT scan is recommended to provide better evaluation.  There is also questionable stones in your Take medication as prescribed. Increase fluids.  Make sure you are drinking at least 8-10 8 ounce glasses of water daily. Develop a toileting schedule that will allow you to urinate at least every 2 hours. Go to the emergency department immediately if you experience worsening pain not controlled by the medication, urinary symptoms, or other concerns. Please follow-up with your primary care physician to discuss the findings from your exam today.  Discussed whether or not you need to follow-up with a urologist. Follow-up as needed.     ED Prescriptions     Medication Sig Dispense Auth. Provider   tamsulosin  (FLOMAX ) 0.4 MG CAPS capsule Take 1 capsule (0.4 mg total) by mouth daily after supper. 30 capsule Leath-Warren, Etta PARAS, NP   acetaminophen  (TYLENOL  8 HOUR) 650 MG CR tablet Take 1 tablet (650 mg total) by mouth every 8 (eight) hours as needed for pain. 30 tablet Leath-Warren, Etta PARAS, NP      PDMP not reviewed this encounter.  Gilmer Etta PARAS, NP 12/08/23 2018

## 2023-12-08 NOTE — Discharge Instructions (Addendum)
 The x-ray of your abdomen does show a possible right kidney stone and in your distal ureteral/bladder..  A CT scan is recommended to provide better evaluation.  There is also questionable stones in your Take medication as prescribed. Increase fluids.  Make sure you are drinking at least 8-10 8 ounce glasses of water daily. Develop a toileting schedule that will allow you to urinate at least every 2 hours. Go to the emergency department immediately if you experience worsening pain not controlled by the medication, urinary symptoms, or other concerns. Please follow-up with your primary care physician to discuss the findings from your exam today.  Discussed whether or not you need to follow-up with a urologist. Follow-up as needed.

## 2023-12-08 NOTE — ED Triage Notes (Signed)
 Pt reports he has had blood in his urine x 30 min ago

## 2023-12-09 LAB — URINE CULTURE: Culture: <10000 — AB

## 2023-12-10 ENCOUNTER — Ambulatory Visit (HOSPITAL_COMMUNITY): Payer: Self-pay

## 2023-12-17 ENCOUNTER — Emergency Department (HOSPITAL_COMMUNITY)
Admission: EM | Admit: 2023-12-17 | Discharge: 2023-12-17 | Disposition: A | Discharge status: Home / Self Care | Attending: Emergency Medicine | Admitting: Emergency Medicine

## 2023-12-17 ENCOUNTER — Emergency Department (HOSPITAL_COMMUNITY)

## 2023-12-17 ENCOUNTER — Other Ambulatory Visit: Payer: Self-pay

## 2023-12-17 ENCOUNTER — Encounter (HOSPITAL_COMMUNITY): Payer: Self-pay | Admitting: Emergency Medicine

## 2023-12-17 DIAGNOSIS — N132 Hydronephrosis with renal and ureteral calculous obstruction: Secondary | ICD-10-CM | POA: Insufficient documentation

## 2023-12-17 DIAGNOSIS — N201 Calculus of ureter: Secondary | ICD-10-CM

## 2023-12-17 DIAGNOSIS — R7989 Other specified abnormal findings of blood chemistry: Secondary | ICD-10-CM | POA: Diagnosis not present

## 2023-12-17 DIAGNOSIS — R109 Unspecified abdominal pain: Secondary | ICD-10-CM | POA: Diagnosis present

## 2023-12-17 LAB — BASIC METABOLIC PANEL WITH GFR
Anion gap: 9 (ref 5–15)
BUN: 15 mg/dL (ref 6–20)
CO2: 25 mmol/L (ref 22–32)
Calcium: 9.4 mg/dL (ref 8.9–10.3)
Chloride: 103 mmol/L (ref 98–111)
Creatinine, Ser: 1.39 mg/dL — ABNORMAL HIGH (ref 0.61–1.24)
GFR, Estimated: >60 mL/min (ref >60)
Glucose, Bld: 120 mg/dL — ABNORMAL HIGH (ref 70–99)
Potassium: 4.8 mmol/L (ref 3.5–5.1)
Sodium: 137 mmol/L (ref 135–145)

## 2023-12-17 LAB — URINALYSIS, ROUTINE W REFLEX MICROSCOPIC
Bacteria, UA: NONE SEEN
Bilirubin Urine: NEGATIVE
Glucose, UA: NEGATIVE mg/dL
Ketones, ur: NEGATIVE mg/dL
Leukocytes,Ua: NEGATIVE
Nitrite: NEGATIVE
Protein, ur: NEGATIVE mg/dL
RBC / HPF: >50 RBC/hpf (ref 0–5)
Specific Gravity, Urine: 1.018 (ref 1.005–1.030)
pH: 6 (ref 5.0–8.0)

## 2023-12-17 LAB — CBC
HCT: 43.9 % (ref 39.0–52.0)
Hemoglobin: 14.6 g/dL (ref 13.0–17.0)
MCH: 30 pg (ref 26.0–34.0)
MCHC: 33.3 g/dL (ref 30.0–36.0)
MCV: 90.1 fL (ref 80.0–100.0)
Platelets: 296 K/uL (ref 150–400)
RBC: 4.87 MIL/uL (ref 4.22–5.81)
RDW: 13 % (ref 11.5–15.5)
WBC: 9 K/uL (ref 4.0–10.5)
nRBC: 0 % (ref 0.0–0.2)

## 2023-12-17 MED ORDER — OXYCODONE-ACETAMINOPHEN 5-325 MG PO TABS: 1.0000 | ORAL_TABLET | ORAL | 0 refills | Status: DC | PRN

## 2023-12-17 MED ORDER — ONDANSETRON HCL 4 MG PO TABS: 4.0000 mg | ORAL_TABLET | Freq: Four times a day (QID) | ORAL | 0 refills | Status: DC

## 2023-12-17 MED ORDER — HYDROMORPHONE HCL 1 MG/ML IJ SOLN
1.0000 mg | Freq: Once | INTRAMUSCULAR | Status: AC
  Administered 2023-12-17: 1 mg via INTRAVENOUS
  Filled 2023-12-17: qty 1

## 2023-12-17 MED ORDER — ONDANSETRON HCL 4 MG/2ML IJ SOLN
4.0000 mg | Freq: Once | INTRAMUSCULAR | Status: AC
  Administered 2023-12-17: 4 mg via INTRAVENOUS
  Filled 2023-12-17: qty 2

## 2023-12-17 NOTE — ED Notes (Signed)
 Patient transported to CT

## 2023-12-17 NOTE — ED Notes (Signed)
 ED Provider at bedside. Gave pt ice water

## 2023-12-17 NOTE — ED Provider Notes (Incomplete)
 [place] EMERGENCY DEPARTMENT AT [facility] Provider Note   CSN: [id number] Arrival date & time: 12/17/23  1745     Patient presents with: Flank Pain (Left)   Jerome Glover is a 38 y.o. male.  {Add pertinent medical, surgical, social history, OB history to HPI:32947}  Flank Pain Pertinent negatives include no abdominal pain.       Jerome Glover is a 38 y.o. male with past medical history of hypertension and recurrent kidney stones who presents to the Emergency Department complaining of gradually worsening right flank pain.  He began having pain to his right flank 1 week ago.  Was seen at urgent care and told he had kidney stone, but states he was not having pain at that time was having some difficulty urinating which is since resolved.  Tonight, he began having pain to his right flank that radiated into his groin area.  He denies any injury, nausea vomiting, fever or chills.  States pain feels similar to previous kidney stone   Prior to Admission medications   Medication Sig Start Date End Date Taking? Authorizing Provider  acetaminophen  (TYLENOL  8 HOUR) 650 MG CR tablet Take 1 tablet (650 mg total) by mouth every 8 (eight) hours as needed for pain. 12/08/23   Leath-Warren, Etta PARAS, NP  BIOTIN PO Take 1 capsule by mouth daily.    [provider]  cetirizine (ZYRTEC) 10 MG tablet Take 10 mg by mouth daily.    [provider]  cyclobenzaprine (FLEXERIL) 10 MG tablet Take 10 mg by mouth 3 (three) times daily as needed. 10/16/22   [provider]  ibuprofen (ADVIL,MOTRIN) 200 MG tablet Take 800 mg by mouth every 6 (six) hours as needed for mild pain or moderate pain.    [provider]  lisinopril (ZESTRIL) 5 MG tablet Take 5 mg by mouth daily. 01/08/23   [provider]  meloxicam (MOBIC) 15 MG tablet Take 15 mg by mouth daily.    [provider]  ondansetron  (ZOFRAN  ODT) 4 MG disintegrating tablet Take 1 tablet  (4 mg total) by mouth every 8 (eight) hours as needed. 01/16/18   Dean Clarity, MD  oxyCODONE -acetaminophen  (PERCOCET/ROXICET) 5-325 MG tablet Take 1 tablet by mouth every 4 (four) hours as needed for severe pain. Patient not taking: Reported on 03/25/2023 01/16/18   Haviland, Julie, MD  scopolamine (TRANSDERM-SCOP) 1 MG/3DAYS Place 1 patch onto the skin every 3 (three) days.  Patient not taking: Reported on 03/25/2023 01/14/18   [provider]  tamsulosin  (FLOMAX ) 0.4 MG CAPS capsule Take 1 capsule (0.4 mg total) by mouth daily after supper. 12/08/23   Leath-Warren, Etta PARAS, NP    Allergies: Patient has no known allergies.    Review of Systems  Constitutional:  Negative for appetite change, chills and fever.  Gastrointestinal:  Negative for abdominal pain, nausea and vomiting.  Genitourinary:  Positive for flank pain. Negative for penile pain, penile swelling, scrotal swelling and testicular pain.  Musculoskeletal:  Negative for back pain.    Updated Vital Signs BP (!) 168/99 (BP Location: Right Arm)   Pulse (!) 57   Temp 98.5 F (36.9 C) (Oral)   Resp (!) 23   Ht 5' 7 (1.702 m)   Wt 94.2 kg   SpO2 98%   BMI 32.53 kg/m   Physical Exam Vitals and nursing note reviewed.  Constitutional:      General: He is not in acute distress.    Appearance: Normal  appearance. He is not toxic-appearing.  HENT:     Mouth/Throat:     Mouth: Mucous membranes are moist.  Cardiovascular:     Rate and Rhythm: Normal rate and regular rhythm.     Pulses: Normal pulses.  Pulmonary:     Effort: Pulmonary effort is normal.  Abdominal:     Palpations: Abdomen is soft.     Tenderness: There is no abdominal tenderness. There is no right CVA tenderness or left CVA tenderness.  Musculoskeletal:        General: Normal range of motion.  Skin:    General: Skin is warm.     Capillary Refill: Capillary refill takes less than 2 seconds.  Neurological:     General: No focal deficit present.      Mental Status: He is alert.     Sensory: No sensory deficit.     Motor: No weakness.     (all labs ordered are listed, but only abnormal results are displayed) Labs Reviewed  URINALYSIS, ROUTINE W REFLEX MICROSCOPIC - Abnormal; Notable for the following components:      Result Value   Hgb urine dipstick LARGE (*)    All other components within normal limits  BASIC METABOLIC PANEL WITH GFR - Abnormal; Notable for the following components:   Glucose, Bld 120 (*)    Creatinine, Ser 1.39 (*)    All other components within normal limits  CBC    EKG: None  Radiology: CT Renal Stone Study Result Date: 12/17/2023 CLINICAL DATA:  Flank pain history of stones EXAM: CT ABDOMEN AND PELVIS WITHOUT CONTRAST TECHNIQUE: Multidetector CT imaging of the abdomen and pelvis was performed following the standard protocol without IV contrast. RADIATION DOSE REDUCTION: This exam was performed according to the departmental dose-optimization program which includes automated exposure control, adjustment of the mA and/or kV according to patient size and/or use of iterative reconstruction technique. COMPARISON:  12/08/2023, CT 01/16/2018 FINDINGS: Lower chest: Lung bases demonstrate no acute airspace disease. Hepatobiliary: No focal liver abnormality is seen. No gallstones, gallbladder wall thickening, or biliary dilatation. Pancreas: Unremarkable. No pancreatic ductal dilatation or surrounding inflammatory changes. Spleen: Normal in size without focal abnormality. Adrenals/Urinary Tract: Adrenal glands are normal. Mild right hydronephrosis and hydroureter, secondary to a 3 mm stone in the distal right ureter about 1-2 cm proximal to the right UVJ. Bladder is unremarkable Stomach/Bowel: Stomach is within normal limits. Appendix appears normal. No evidence of bowel wall thickening, distention, or inflammatory changes. Vascular/Lymphatic: No significant vascular findings are present. No enlarged abdominal or pelvic lymph  nodes. Reproductive: Prostate is unremarkable. Other: No abdominal wall hernia or abnormality. No abdominopelvic ascites. Musculoskeletal: No acute or significant osseous findings. IMPRESSION: Mild right hydronephrosis and hydroureter, secondary to a 3 mm stone in the distal right ureter about 1-2 cm proximal to the right UVJ. Electronically Signed   By: Luke Bun M.D.   On: 12/17/2023 22:04    {Document cardiac monitor, telemetry assessment procedure when appropriate:32947} Procedures   Medications Ordered in the ED  ondansetron  (ZOFRAN ) injection 4 mg (4 mg Intravenous Given 12/17/23 2143)  HYDROmorphone  (DILAUDID ) injection 1 mg (1 mg Intravenous Given 12/17/23 2143)      {Click here for ABCD2, HEART and other calculators REFRESH Note before signing:1}                              Medical Decision Making Patient here with dysuria symptoms several days ago  has history of kidney stones.  Developed flank pain earlier today that radiated into his right groin.  Pain felt similar to previous kidney stone.  Denies any nausea vomiting fever chills or difficulty urinating at this time.  Clinically, I suspect this is recurrent kidney stone.  Differential would also include pyelonephritis, MSK UTI.  Amount and/or Complexity of Data Reviewed Labs: ordered. Radiology: ordered.  Risk Prescription drug management.     {Document critical care time when appropriate  Document review of labs and clinical decision tools ie CHADS2VASC2, etc  Document your independent review of radiology images and any outside records  Document your discussion with family members, caretakers and with consultants  Document social determinants of health affecting pt's care  Document your decision making why or why not admission, treatments were needed:32947:::1}   Final diagnoses:  Ureterolithiasis    ED Discharge Orders     None

## 2023-12-17 NOTE — ED Notes (Signed)
 ED Provider at bedside.

## 2023-12-17 NOTE — ED Notes (Signed)
 Pt given urine strainer and cup with DC paperwork.

## 2023-12-17 NOTE — ED Triage Notes (Signed)
 Pt presents with left flank that started around 4 pm, history of kidney stones.

## 2023-12-17 NOTE — ED Provider Notes (Signed)
 [place] EMERGENCY DEPARTMENT AT [facility] Provider Note   CSN: [id number] Arrival date & time: 12/17/23  1745     Patient presents with: Flank Pain (Left)   Jerome Glover is a 38 y.o. male.    Flank Pain Pertinent negatives include no abdominal pain.       Jerome Glover is a 38 y.o. male with past medical history of hypertension and recurrent kidney stones who presents to the Emergency Department complaining of gradually worsening right flank pain.  He began having pain to his right flank 1 week ago.  Was seen at urgent care and told he had kidney stone, but states he was not having pain at that time was having some difficulty urinating which is since resolved.  Tonight, he began having pain to his right flank that radiated into his groin area.  He denies any injury, nausea vomiting, fever or chills.  States pain feels similar to previous kidney stone   Prior to Admission medications   Medication Sig Start Date End Date Taking? Authorizing Provider  acetaminophen  (TYLENOL  8 HOUR) 650 MG CR tablet Take 1 tablet (650 mg total) by mouth every 8 (eight) hours as needed for pain. 12/08/23   Leath-Warren, Etta PARAS, NP  BIOTIN PO Take 1 capsule by mouth daily.    [provider]  cetirizine (ZYRTEC) 10 MG tablet Take 10 mg by mouth daily.    [provider]  cyclobenzaprine (FLEXERIL) 10 MG tablet Take 10 mg by mouth 3 (three) times daily as needed. 10/16/22   [provider]  ibuprofen (ADVIL,MOTRIN) 200 MG tablet Take 800 mg by mouth every 6 (six) hours as needed for mild pain or moderate pain.    [provider]  lisinopril (ZESTRIL) 5 MG tablet Take 5 mg by mouth daily. 01/08/23   [provider]  meloxicam (MOBIC) 15 MG tablet Take 15 mg by mouth daily.    [provider]  ondansetron  (ZOFRAN  ODT) 4 MG disintegrating tablet Take 1 tablet (4 mg total) by mouth every 8 (eight) hours as needed. 01/16/18   Dean Clarity, MD  oxyCODONE -acetaminophen  (PERCOCET/ROXICET) 5-325 MG tablet Take 1 tablet by mouth every 4 (four) hours as needed for severe pain. Patient not taking: Reported on 03/25/2023 01/16/18   Haviland, Julie, MD  scopolamine (TRANSDERM-SCOP) 1 MG/3DAYS Place 1 patch onto the skin every 3 (three) days.  Patient not taking: Reported on 03/25/2023 01/14/18   [provider]  tamsulosin  (FLOMAX ) 0.4 MG CAPS capsule Take 1 capsule (0.4 mg total) by mouth daily after supper. 12/08/23   Leath-Warren, Etta PARAS, NP    Allergies: Patient has no known allergies.    Review of Systems  Constitutional:  Negative for appetite change, chills and fever.  Gastrointestinal:  Negative for abdominal pain, nausea and vomiting.  Genitourinary:  Positive for flank pain. Negative for penile pain, penile swelling, scrotal swelling and testicular pain.  Musculoskeletal:  Negative for back pain.    Updated Vital Signs BP (!) 168/99 (BP Location: Right Arm)   Pulse (!) 57   Temp 98.5 F (36.9 C) (Oral)   Resp (!) 23   Ht 5' 7 (1.702 m)   Wt 94.2 kg   SpO2 98%   BMI 32.53 kg/m   Physical Exam Vitals and nursing note reviewed.  Constitutional:      General: He is not in acute distress.    Appearance: Normal appearance. He is not toxic-appearing.  HENT:  Mouth/Throat:     Mouth: Mucous membranes are moist.  Cardiovascular:     Rate and Rhythm: Normal rate and regular rhythm.     Pulses: Normal pulses.  Pulmonary:     Effort: Pulmonary effort is normal.  Abdominal:     Palpations: Abdomen is soft.     Tenderness: There is no abdominal tenderness. There is no right CVA tenderness or left CVA tenderness.  Musculoskeletal:        General: Normal range of motion.  Skin:    General: Skin is warm.     Capillary Refill: Capillary refill takes less than 2 seconds.  Neurological:     General: No focal deficit present.     Mental Status: He is alert.     Sensory: No sensory deficit.      Motor: No weakness.     (all labs ordered are listed, but only abnormal results are displayed) Labs Reviewed  URINALYSIS, ROUTINE W REFLEX MICROSCOPIC - Abnormal; Notable for the following components:      Result Value   Hgb urine dipstick LARGE (*)    All other components within normal limits  BASIC METABOLIC PANEL WITH GFR - Abnormal; Notable for the following components:   Glucose, Bld 120 (*)    Creatinine, Ser 1.39 (*)    All other components within normal limits  CBC    EKG: None  Radiology: CT Renal Stone Study Result Date: 12/17/2023 CLINICAL DATA:  Flank pain history of stones EXAM: CT ABDOMEN AND PELVIS WITHOUT CONTRAST TECHNIQUE: Multidetector CT imaging of the abdomen and pelvis was performed following the standard protocol without IV contrast. RADIATION DOSE REDUCTION: This exam was performed according to the departmental dose-optimization program which includes automated exposure control, adjustment of the mA and/or kV according to patient size and/or use of iterative reconstruction technique. COMPARISON:  12/08/2023, CT 01/16/2018 FINDINGS: Lower chest: Lung bases demonstrate no acute airspace disease. Hepatobiliary: No focal liver abnormality is seen. No gallstones, gallbladder wall thickening, or biliary dilatation. Pancreas: Unremarkable. No pancreatic ductal dilatation or surrounding inflammatory changes. Spleen: Normal in size without focal abnormality. Adrenals/Urinary Tract: Adrenal glands are normal. Mild right hydronephrosis and hydroureter, secondary to a 3 mm stone in the distal right ureter about 1-2 cm proximal to the right UVJ. Bladder is unremarkable Stomach/Bowel: Stomach is within normal limits. Appendix appears normal. No evidence of bowel wall thickening, distention, or inflammatory changes. Vascular/Lymphatic: No significant vascular findings are present. No enlarged abdominal or pelvic lymph nodes. Reproductive: Prostate is unremarkable. Other: No abdominal  wall hernia or abnormality. No abdominopelvic ascites. Musculoskeletal: No acute or significant osseous findings. IMPRESSION: Mild right hydronephrosis and hydroureter, secondary to a 3 mm stone in the distal right ureter about 1-2 cm proximal to the right UVJ. Electronically Signed   By: Luke Bun M.D.   On: 12/17/2023 22:04     Procedures   Medications Ordered in the ED  ondansetron  (ZOFRAN ) injection 4 mg (4 mg Intravenous Given 12/17/23 2143)  HYDROmorphone  (DILAUDID ) injection 1 mg (1 mg Intravenous Given 12/17/23 2143)                                    Medical Decision Making Patient here with dysuria symptoms several days ago has history of kidney stones.  Developed flank pain earlier today that radiated into his right groin.  Pain felt similar to previous kidney stone.  Denies any nausea  vomiting fever chills or difficulty urinating at this time.  Clinically, I suspect this is recurrent kidney stone.  Differential would also include pyelonephritis, MSK UTI.  Amount and/or Complexity of Data Reviewed Labs: ordered.    Details: Labs show elevated creatinine, near baseline from creat from 2019  Radiology: ordered.    Details: CT renal stone shows a 3 mm stone at the right distal ureter with hydroureter and hydronephrosis  Discussion of management or test interpretation with external provider(s): Pt with Kidney stone , likely a size that he will be able to pass, pain improved after medications given here,  he has Flomax  at home and has f/u with urology.  Given rx for pain medication and anti emetic.  Return precautions given  Risk Prescription drug management.        Final diagnoses:  Ureterolithiasis    ED Discharge Orders     None          Herlinda Milling, PA-C 12/19/23 1233    Charlyn Sora, MD 12/25/23 [phone]

## 2023-12-17 NOTE — Discharge Instructions (Signed)
 Keep your upcoming appointment with urology.  Return to the emergency department for any new or worsening symptoms.  Continue to take your Flomax  as directed

## 2023-12-23 ENCOUNTER — Other Ambulatory Visit: Payer: Self-pay | Admitting: Urology

## 2023-12-23 ENCOUNTER — Ambulatory Visit: Admitting: Urology

## 2023-12-23 VITALS — BP 155/97 | HR 80 | Ht 67.0 in | Wt 208.0 lb

## 2023-12-23 DIAGNOSIS — N289 Disorder of kidney and ureter, unspecified: Secondary | ICD-10-CM | POA: Diagnosis not present

## 2023-12-23 DIAGNOSIS — N201 Calculus of ureter: Secondary | ICD-10-CM | POA: Diagnosis not present

## 2023-12-23 LAB — MICROSCOPIC EXAMINATION: Mucus, UA: POSITIVE — AB

## 2023-12-23 LAB — URINALYSIS, ROUTINE W REFLEX MICROSCOPIC
Bilirubin, UA: NEGATIVE
Leukocytes,UA: NEGATIVE
Nitrite, UA: NEGATIVE
Protein,UA: NEGATIVE
Specific Gravity, UA: 1.025 (ref 1.005–1.030)
Urobilinogen, Ur: 0.2 mg/dL (ref 0.2–1.0)
pH, UA: 5.5 (ref 5.0–7.5)

## 2023-12-23 NOTE — Progress Notes (Signed)
 Chief Complaint: Kidney stone   History of Present Illness:  Jerome Glover is a 38 y.o. male who is seen in consultation from Leonce Lucie PARAS, PA-C for evaluation of a ureteral calculus.  He has passed 1 stone in 2018/01/08.  He became symptomatic around 19 August with gross hematuria.  He had a KUB done which showed a kidney stone, pelvic phleboliths.  He became more symptomatic with pain around the 28th.  He went to the emergency room where a 4 mm right distal ureteral stone with hydronephrosis was noted.  His last pain was a couple of days ago.  Lower urinary tract symptoms that he experienced before terminated recently.  He has had no gross hematuria.  He did have a creatinine which was about 1.4.  They bring those labs in today.  That was on the 27th of last month, the day before the emergency room visit.   Past Medical History:  Past Medical History:  Diagnosis Date   Hypertension     Past Surgical History:  Past Surgical History:  Procedure Laterality Date   HEMORRHOID SURGERY N/A 10/12/2015   Procedure: EXTENSIVE HEMORRHOIDECTOMY;  Surgeon: Selinda Artist Moats, MD;  Location: [facility];  Service: General;  Laterality: N/A;   TONSILLECTOMY      Allergies:  No Known Allergies  Family History:  Family History  Problem Relation Age of Onset   Hypertension Mother    Diabetes Mother    Hypertension Father    Diabetes Father     Social History:  Social History   Tobacco Use   Smoking status: Every Day    Current packs/day: 0.50    Average packs/day: 0.5 packs/day for 15.0 years (7.5 ttl pk-yrs)    Types: Cigarettes   Smokeless tobacco: Former  Building services engineer status: Former  Substance Use Topics   Alcohol use: Yes    Comment: rare   Drug use: No    Review of symptoms:  Constitutional:  Negative for unexplained weight loss, night sweats, fever, chills ENT:  Negative for nose bleeds, sinus pain, painful swallowing CV:  Negative for chest pain, shortness  of breath, exercise intolerance, palpitations, loss of consciousness Resp:  Negative for cough, wheezing, shortness of breath GI:  Negative for nausea, vomiting, diarrhea, bloody stools GU:  Positives noted in HPI; otherwise negative for gross hematuria, dysuria, urinary incontinence Neuro:  Negative for seizures, poor balance, limb weakness, slurred speech Psych:  Negative for lack of energy, depression, anxiety Endocrine:  Negative for polydipsia, polyuria, symptoms of hypoglycemia (dizziness, hunger, sweating) Hematologic:  Negative for anemia, purpura, petechia, prolonged or excessive bleeding, use of anticoagulants  Allergic:  Negative for difficulty breathing or choking as a result of exposure to anything; no shellfish allergy; no allergic response (rash/itch) to materials, foods  Physical exam: There were no vitals taken for this visit. GENERAL APPEARANCE:  Well appearing, well developed, well nourished, NAD HEENT: Atraumatic, Normocephalic. NECK: Normal appearance LUNGS: Normal inspiratory and expiratory excursion HEART: Regular Rate EXTREMITIES: Moves all extremities well.  Without clubbing, cyanosis, or edema. NEUROLOGIC:  Alert and oriented x 3, normal gait, CN II-XII grossly intact.  MENTAL STATUS:  Appropriate. SKIN:  Warm, dry and intact.    Results:  I have reviewed referring/prior physicians notes  I have reviewed urinalysis--clear  I have reviewed PSA results  I have reviewed prior imaging--CT from 8.28.2025_  Mild right hydronephrosis and hydroureter, secondary to a 3 mm stone in the distal right ureter  about 1-2 cm proximal to the right UVJ.   I have reviewed urine culture results  Assessment: 1.  Right ureteral stone, most likely passed  2.  Acute renal insufficiency, most likely secondary to the associated hydronephrosis which most likely has dissipated  Plan: 1.  I told the patient and his wife that more than likely he has passed a stone  2.  Stone  prevention diet discussed  3.  Basic metabolic panel was drawn today-results through epic system  4.  Stop smoking  5.  Return as needed

## 2023-12-24 ENCOUNTER — Ambulatory Visit: Payer: Self-pay | Admitting: Urology

## 2023-12-24 LAB — BASIC METABOLIC PANEL WITH GFR
BUN/Creatinine Ratio: 7 — ABNORMAL LOW (ref 9–20)
BUN: 9 mg/dL (ref 6–20)
CO2: 24 mmol/L (ref 20–29)
Calcium: 9.6 mg/dL (ref 8.7–10.2)
Chloride: 103 mmol/L (ref 96–106)
Creatinine, Ser: 1.32 mg/dL — ABNORMAL HIGH (ref 0.76–1.27)
Glucose: 91 mg/dL (ref 70–99)
Potassium: 5.2 mmol/L (ref 3.5–5.2)
Sodium: 140 mmol/L (ref 134–144)
eGFR: 71 mL/min/1.73 (ref >59)

## 2023-12-31 ENCOUNTER — Emergency Department (HOSPITAL_COMMUNITY)

## 2023-12-31 ENCOUNTER — Encounter (HOSPITAL_COMMUNITY): Payer: Self-pay

## 2023-12-31 ENCOUNTER — Emergency Department (HOSPITAL_COMMUNITY)
Admission: EM | Admit: 2023-12-31 | Discharge: 2023-12-31 | Disposition: A | Discharge status: Home / Self Care | Attending: Emergency Medicine | Admitting: Emergency Medicine

## 2023-12-31 ENCOUNTER — Other Ambulatory Visit: Payer: Self-pay

## 2023-12-31 DIAGNOSIS — Z79899 Other long term (current) drug therapy: Secondary | ICD-10-CM | POA: Diagnosis not present

## 2023-12-31 DIAGNOSIS — N200 Calculus of kidney: Secondary | ICD-10-CM | POA: Diagnosis not present

## 2023-12-31 DIAGNOSIS — M545 Low back pain, unspecified: Secondary | ICD-10-CM | POA: Diagnosis present

## 2023-12-31 HISTORY — DX: Calculus of kidney: N20.0

## 2023-12-31 LAB — CBC
HCT: 44.1 % (ref 39.0–52.0)
Hemoglobin: 15 g/dL (ref 13.0–17.0)
MCH: 30.5 pg (ref 26.0–34.0)
MCHC: 34 g/dL (ref 30.0–36.0)
MCV: 89.8 fL (ref 80.0–100.0)
Platelets: 296 K/uL (ref 150–400)
RBC: 4.91 MIL/uL (ref 4.22–5.81)
RDW: 12.9 % (ref 11.5–15.5)
WBC: 13.4 K/uL — ABNORMAL HIGH (ref 4.0–10.5)
nRBC: 0 % (ref 0.0–0.2)

## 2023-12-31 LAB — URINALYSIS, ROUTINE W REFLEX MICROSCOPIC
Bacteria, UA: NONE SEEN
Bilirubin Urine: NEGATIVE
Glucose, UA: NEGATIVE mg/dL
Ketones, ur: NEGATIVE mg/dL
Leukocytes,Ua: NEGATIVE
Nitrite: NEGATIVE
Protein, ur: NEGATIVE mg/dL
Specific Gravity, Urine: 1.012 (ref 1.005–1.030)
pH: 5 (ref 5.0–8.0)

## 2023-12-31 LAB — COMPREHENSIVE METABOLIC PANEL WITH GFR
ALT: 25 U/L (ref 0–44)
AST: 21 U/L (ref 15–41)
Albumin: 4 g/dL (ref 3.5–5.0)
Alkaline Phosphatase: 66 U/L (ref 38–126)
Anion gap: 15 (ref 5–15)
BUN: 13 mg/dL (ref 6–20)
CO2: 23 mmol/L (ref 22–32)
Calcium: 9.6 mg/dL (ref 8.9–10.3)
Chloride: 102 mmol/L (ref 98–111)
Creatinine, Ser: 1.36 mg/dL — ABNORMAL HIGH (ref 0.61–1.24)
GFR, Estimated: >60 mL/min (ref >60)
Glucose, Bld: 167 mg/dL — ABNORMAL HIGH (ref 70–99)
Potassium: 5.1 mmol/L (ref 3.5–5.1)
Sodium: 140 mmol/L (ref 135–145)
Total Bilirubin: 0.8 mg/dL (ref 0.0–1.2)
Total Protein: 7.3 g/dL (ref 6.5–8.1)

## 2023-12-31 LAB — LIPASE, BLOOD: Lipase: 35 U/L (ref 11–51)

## 2023-12-31 MED ORDER — ONDANSETRON HCL 4 MG PO TABS: 4.0000 mg | ORAL_TABLET | Freq: Four times a day (QID) | ORAL | 0 refills | Status: AC

## 2023-12-31 MED ORDER — OXYCODONE-ACETAMINOPHEN 5-325 MG PO TABS: 1.0000 | ORAL_TABLET | ORAL | 0 refills | Status: AC | PRN

## 2023-12-31 MED ORDER — OXYCODONE-ACETAMINOPHEN 5-325 MG PO TABS
2.0000 | ORAL_TABLET | Freq: Once | ORAL | Status: AC
  Administered 2023-12-31: 2 via ORAL
  Filled 2023-12-31: qty 2

## 2023-12-31 MED ORDER — TAMSULOSIN HCL 0.4 MG PO CAPS: 0.4000 mg | ORAL_CAPSULE | Freq: Every day | ORAL | 0 refills | Status: AC

## 2023-12-31 NOTE — Discharge Instructions (Signed)
 Return if any problems.

## 2023-12-31 NOTE — ED Triage Notes (Signed)
 Pt c/o rt side flank pain starting this morning. Pt has had nausea denies vomiting. Pt reports burning with urination. Pt denies problems with bowl movements.

## 2023-12-31 NOTE — ED Provider Notes (Signed)
 [place] EMERGENCY DEPARTMENT AT [facility] Provider Note   CSN: [id number] Arrival date & time: 12/31/23  1315     Patient presents with: Back Pain   [name] is a 38 y.o. male.   Patient complains of pain in his right low back.  Patient reports he had a similar episode last month and was diagnosed with a kidney stone.  Patient saw the urologist and was told that the stone had passed.  Patient states that this pain feels similar.  Patient complains of nausea he has not had any vomiting he denies any diarrhea he is not having any abdominal pain.  Patient denies any fever or chills.  He has not had any burning with urination  The history is provided by the patient. No language interpreter was used.  Back Pain      Prior to Admission medications   Medication Sig Start Date End Date Taking? Authorizing Provider  acetaminophen  (TYLENOL  8 HOUR) 650 MG CR tablet Take 1 tablet (650 mg total) by mouth every 8 (eight) hours as needed for pain. 12/08/23   Leath-Warren, Etta PARAS, NP  BIOTIN PO Take 1 capsule by mouth daily.    [provider]  cetirizine (ZYRTEC) 10 MG tablet Take 10 mg by mouth daily.    [provider]  cyclobenzaprine (FLEXERIL) 10 MG tablet Take 10 mg by mouth 3 (three) times daily as needed. 10/16/22   [provider]  ibuprofen (ADVIL,MOTRIN) 200 MG tablet Take 800 mg by mouth every 6 (six) hours as needed for mild pain or moderate pain.    [provider]  lisinopril (ZESTRIL) 5 MG tablet Take 5 mg by mouth daily. 01/08/23   [provider]  meloxicam (MOBIC) 15 MG tablet Take 15 mg by mouth daily.    [provider]  ondansetron  (ZOFRAN  ODT) 4 MG disintegrating tablet Take 1 tablet (4 mg total) by mouth every 8 (eight) hours as needed. 01/16/18   Dean Clarity, MD  ondansetron  (ZOFRAN ) 4 MG tablet Take 1 tablet (4 mg total) by mouth every 6 (six) hours. As needed for nausea vomiting 12/31/23    Koraline Phillipson K, PA-C  oxyCODONE -acetaminophen  (PERCOCET/ROXICET) 5-325 MG tablet Take 1 tablet by mouth every 4 (four) hours as needed. 12/31/23   Chevon Laufer K, PA-C  scopolamine (TRANSDERM-SCOP) 1 MG/3DAYS Place 1 patch onto the skin every 3 (three) days.  Patient not taking: Reported on 12/23/2023 01/14/18   [provider]  tamsulosin  (FLOMAX ) 0.4 MG CAPS capsule Take 1 capsule (0.4 mg total) by mouth daily after supper. 12/31/23   Gao Mitnick K, PA-C    Allergies: Patient has no known allergies.    Review of Systems  Musculoskeletal:  Positive for back pain.  All other systems reviewed and are negative.   Updated Vital Signs BP (!) 141/81 (BP Location: Right Arm)   Pulse 63   Temp 98.6 F (37 C) (Oral)   Resp 18   Ht 5' 7 (1.702 m)   Wt 95.3 kg   SpO2 96%   BMI 32.89 kg/m   Physical Exam Vitals reviewed.  Constitutional:      Appearance: Normal appearance.  HENT:     Mouth/Throat:     Mouth: Mucous membranes are moist.  Cardiovascular:     Rate and Rhythm: Normal rate.  Pulmonary:     Effort: Pulmonary effort is normal.  Abdominal:     General: Abdomen is flat.     Tenderness:  There is no abdominal tenderness.  Musculoskeletal:        General: Normal range of motion.     Cervical back: Normal range of motion.  Skin:    General: Skin is warm.  Neurological:     General: No focal deficit present.     Mental Status: He is alert.     (all labs ordered are listed, but only abnormal results are displayed) Labs Reviewed  COMPREHENSIVE METABOLIC PANEL WITH GFR - Abnormal; Notable for the following components:      Result Value   Glucose, Bld 167 (*)    Creatinine, Ser 1.36 (*)    All other components within normal limits  CBC - Abnormal; Notable for the following components:   WBC 13.4 (*)    All other components within normal limits  URINALYSIS, ROUTINE W REFLEX MICROSCOPIC - Abnormal; Notable for the following components:   Hgb urine dipstick  MODERATE (*)    All other components within normal limits  LIPASE, BLOOD    EKG: None  Radiology: CT Renal Stone Study Result Date: 12/31/2023 CLINICAL DATA:  Abdominal/flank pain, stone suspected. EXAM: CT ABDOMEN AND PELVIS WITHOUT CONTRAST TECHNIQUE: Multidetector CT imaging of the abdomen and pelvis was performed following the standard protocol without IV contrast. RADIATION DOSE REDUCTION: This exam was performed according to the departmental dose-optimization program which includes automated exposure control, adjustment of the mA and/or kV according to patient size and/or use of iterative reconstruction technique. COMPARISON:  CT scan renal stone protocol 12/17/2023. FINDINGS: Lower chest: There are dependent changes in the visualized lung bases. No overt consolidation. No pleural effusion. The heart is normal in size. No pericardial effusion. Hepatobiliary: The liver is normal in size. Non-cirrhotic configuration. No suspicious mass. No intrahepatic or extrahepatic bile duct dilation. No calcified gallstones. Normal gallbladder wall thickness. No pericholecystic inflammatory changes. Pancreas: Unremarkable. No pancreatic ductal dilatation or surrounding inflammatory changes. Spleen: Within normal limits. No focal lesion. Adrenals/Urinary Tract: Adrenal glands are unremarkable. No suspicious renal mass within the limitations of this unenhanced exam. No left nephroureterolithiasis or obstructive uropathy. There is inferior migration of previously seen 2 x 4 mm right ureteric calculus, which now lies at the level of vesicoureteric junction. There is resultant mild proximal obstructive uropathy, grossly similar to the prior study. No other right nephroureterolithiasis. Urinary bladder is under distended, precluding optimal assessment. However, no large mass identified. No perivesical fat stranding. Stomach/Bowel: No disproportionate dilation of the small or large bowel loops. No evidence of abnormal  bowel wall thickening or inflammatory changes. The appendix is unremarkable. Vascular/Lymphatic: No ascites or pneumoperitoneum. No abdominal or pelvic lymphadenopathy, by size criteria. No aneurysmal dilation of the major abdominal arteries. Reproductive: Normal size prostate. Symmetric seminal vesicles. Other: There is a tiny fat containing umbilical hernia. The soft tissues and abdominal wall are otherwise unremarkable. Musculoskeletal: No suspicious osseous lesions. IMPRESSION: 1. There is inferior migration of previously seen 2 x 4 mm right ureteric calculus, which now lies at the level of vesicoureteric junction. There is resultant mild proximal obstructive uropathy, grossly similar to the prior study. No other nephroureterolithiasis on either side. No left obstructive uropathy. 2. Multiple other nonacute observations, as described above. Electronically Signed   By: Ree Molt M.D.   On: 12/31/2023 15:44     Procedures   Medications Ordered in the ED  oxyCODONE -acetaminophen  (PERCOCET/ROXICET) 5-325 MG per tablet 2 tablet (2 tablets Oral Given 12/31/23 1601)  Medical Decision Making Pt complains of right low back pain.  Pt reports pain feels like previous stones   Amount and/or Complexity of Data Reviewed External Data Reviewed: notes.    Details: [facility] notes reviewed Labs: ordered. Decision-making details documented in ED Course.    Details: Ua shows blood  Radiology: ordered.    Details: Ct scan shows 4mm calculus at the UVJ   Risk Prescription drug management. Risk Details: Pt given rx for zofran , flomax  and percocet        Final diagnoses:  Kidney stones    ED Discharge Orders          Ordered    tamsulosin  (FLOMAX ) 0.4 MG CAPS capsule  Daily after supper        12/31/23 1716    ondansetron  (ZOFRAN ) 4 MG tablet  Every 6 hours        12/31/23 1716    oxyCODONE -acetaminophen  (PERCOCET/ROXICET) 5-325 MG tablet  Every 4 hours  PRN        12/31/23 1716            An After Visit Summary was printed and given to the patient.    Flint Sonny POUR, PA-C 12/31/23 1731    Suzette Pac, MD 01/02/24 [phone]
